# Patient Record
Sex: Male | Born: 1965 | Race: Black or African American | Hispanic: No | Marital: Married | Smoking: Current every day smoker
Health system: Southern US, Community
[De-identification: ages and names within clinical notes are randomized; demographics above are authoritative.]

## PROBLEM LIST (undated history)

## (undated) DIAGNOSIS — M1990 Unspecified osteoarthritis, unspecified site: Secondary | ICD-10-CM

## (undated) DIAGNOSIS — S21119A Laceration without foreign body of unspecified front wall of thorax without penetration into thoracic cavity, initial encounter: Secondary | ICD-10-CM

---
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
## 2003-01-03 ENCOUNTER — Emergency Department (HOSPITAL_COMMUNITY): Admission: EM | Admit: 2003-01-03 | Discharge: 2003-01-03 | Payer: Self-pay

## 2003-07-02 ENCOUNTER — Emergency Department (HOSPITAL_COMMUNITY): Admission: EM | Admit: 2003-07-02 | Discharge: 2003-07-02 | Payer: Self-pay | Admitting: Emergency Medicine

## 2003-11-01 ENCOUNTER — Emergency Department (HOSPITAL_COMMUNITY): Admission: EM | Admit: 2003-11-01 | Discharge: 2003-11-01 | Payer: Self-pay | Admitting: Emergency Medicine

## 2004-06-05 ENCOUNTER — Emergency Department (HOSPITAL_COMMUNITY): Admission: EM | Admit: 2004-06-05 | Discharge: 2004-06-05 | Payer: Self-pay | Admitting: Emergency Medicine

## 2004-11-16 ENCOUNTER — Ambulatory Visit: Payer: Self-pay | Admitting: Internal Medicine

## 2008-04-09 ENCOUNTER — Emergency Department (HOSPITAL_COMMUNITY): Admission: EM | Admit: 2008-04-09 | Discharge: 2008-04-09 | Payer: Self-pay | Admitting: Emergency Medicine

## 2008-07-14 ENCOUNTER — Emergency Department (HOSPITAL_COMMUNITY): Admission: EM | Admit: 2008-07-14 | Discharge: 2008-07-14 | Payer: Self-pay | Admitting: Emergency Medicine

## 2010-07-15 LAB — CBC
MCHC: 33.1 g/dL (ref 30.0–36.0)
MCV: 93.1 fL (ref 78.0–100.0)
RBC: 3.96 MIL/uL — ABNORMAL LOW (ref 4.22–5.81)
RDW: 13.5 % (ref 11.5–15.5)

## 2010-07-15 LAB — DIFFERENTIAL
Lymphocytes Relative: 49 % — ABNORMAL HIGH (ref 12–46)
Lymphs Abs: 2.3 10*3/uL (ref 0.7–4.0)
Neutrophils Relative %: 44 % (ref 43–77)

## 2010-07-15 LAB — POCT CARDIAC MARKERS
Myoglobin, poc: 65.2 ng/mL (ref 12–200)
Troponin i, poc: <0.05 ng/mL (ref 0.00–0.09)

## 2010-07-15 LAB — COMPREHENSIVE METABOLIC PANEL
AST: 28 U/L (ref 0–37)
CO2: 25 mEq/L (ref 19–32)
Calcium: 9 mg/dL (ref 8.4–10.5)
Creatinine, Ser: 0.81 mg/dL (ref 0.4–1.5)
GFR calc Af Amer: >60 mL/min (ref >60)
GFR calc non Af Amer: >60 mL/min (ref >60)

## 2012-07-14 ENCOUNTER — Encounter (HOSPITAL_COMMUNITY): Payer: Self-pay | Admitting: Emergency Medicine

## 2012-07-14 ENCOUNTER — Emergency Department (HOSPITAL_COMMUNITY)
Admission: EM | Admit: 2012-07-14 | Discharge: 2012-07-14 | Disposition: A | Discharge status: Home / Self Care | Payer: Self-pay | Attending: Emergency Medicine | Admitting: Emergency Medicine

## 2012-07-14 ENCOUNTER — Emergency Department (HOSPITAL_COMMUNITY): Payer: Self-pay

## 2012-07-14 DIAGNOSIS — M25519 Pain in unspecified shoulder: Secondary | ICD-10-CM | POA: Insufficient documentation

## 2012-07-14 DIAGNOSIS — M25511 Pain in right shoulder: Secondary | ICD-10-CM

## 2012-07-14 DIAGNOSIS — F17200 Nicotine dependence, unspecified, uncomplicated: Secondary | ICD-10-CM | POA: Insufficient documentation

## 2012-07-14 MED ORDER — HYDROCODONE-ACETAMINOPHEN 5-325 MG PO TABS: 1.0000 | ORAL_TABLET | ORAL | Status: DC | PRN

## 2012-07-14 MED ORDER — HYDROCODONE-ACETAMINOPHEN 5-325 MG PO TABS
1.0000 | ORAL_TABLET | Freq: Once | ORAL | Status: AC
  Administered 2012-07-14: 1 via ORAL
  Filled 2012-07-14: qty 1

## 2012-07-14 NOTE — ED Provider Notes (Signed)
History    This chart was scribed for non-physician practitioner working with Arthur Lennert, MD by Arthur Wise, ED Scribe. This patient was seen in room WTR5/WTR5 and the patient's care was started at 1553.   CSN: [id number]  Arrival date & time 07/14/12  1546   First MD Initiated Contact with Patient 07/14/12 1553      Chief Complaint  Patient presents with  . RT shoulder pain     (Consider location/radiation/quality/duration/timing/severity/associated sxs/prior treatment) The history is provided by the patient and medical records. No language interpreter was used.   Arthur Wise is a 47 y.o. male who presents to the Emergency Department complaining of sudden onset, sharp right shoulder pain that is aggravated by laying down and turning his body and improved by raising his arm that comes and goes and began 1 week ago. He denies any trauma or injury to the area, SOB, dizziness, or weakness. He states that he has been treating the pain at home with ibuprofen, Tylenol, muscle rubs, pushup, and lifting weights. 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   History reviewed. No pertinent past medical history.  History reviewed. No pertinent past surgical history.  Family History  Problem Relation Age of Onset  . Cancer Mother   . Diabetes Sister     History  Substance Use Topics  . Smoking status: Current Every Day Smoker  . Smokeless tobacco: Not on file  . Alcohol Use: Yes      Review of Systems  Musculoskeletal: Positive for arthralgias (right shoulder).  All other systems reviewed and are negative.    Allergies  Review of patient's allergies indicates no known allergies.  Home Medications   Current Outpatient Rx  Name  Route  Sig  Dispense  Refill  . ibuprofen (ADVIL,MOTRIN) 200 MG tablet   Oral   Take 400 mg by mouth every 6 (six) hours as needed for pain.         Marland Kitchen  HYDROcodone-acetaminophen (NORCO/VICODIN) 5-325 MG per tablet   Oral   Take 1 tablet by mouth every 4 (four) hours as needed for pain.   12 tablet   0     BP 118/72  Pulse 84  Temp(Src) 98.2 F (36.8 C) (Oral)  Resp 18  SpO2 96%  Physical Exam  Nursing note and vitals reviewed. Constitutional: He is oriented to person, place, and time. He appears well-developed and well-nourished. No distress.  HENT:  Head: Normocephalic and atraumatic.  Eyes: EOM are normal. Pupils are equal, round, and reactive to light.  Neck: Normal range of motion. Neck supple. No tracheal deviation present.  Cardiovascular: Normal rate.   Pulmonary/Chest: Effort normal. No respiratory distress.  Abdominal: Soft. He exhibits no distension.  Musculoskeletal: Normal range of motion. He exhibits no edema.       Right shoulder: He exhibits tenderness, pain and spasm. He exhibits normal range of motion, no bony tenderness, no swelling, no effusion, no crepitus, no deformity, no laceration, normal pulse and normal strength.       Arms: Neurological: He is alert and oriented to person, place, and time.  Skin: Skin is warm and dry.  Psychiatric: He has a normal mood and affect. His behavior is normal.    ED Course  Procedures (including critical care time)  DIAGNOSTIC STUDIES: Oxygen Saturation is 96% on room air, adequate by my interpretation.    COORDINATION OF CARE:  17:11- Discussed planned course of treatment with the patient, including antiinflammatories, pain medication, resting the arm when he is not at work, and following up with an orthopedist, who is agreeable at this time.  Labs Reviewed - No data to display Dg Shoulder Right  07/14/2012  *RADIOLOGY REPORT*  Clinical Data: Right scapula pain  RIGHT SHOULDER - 2+ VIEW  Comparison: None.  Findings: Three views of the right shoulder submitted. Degenerative changes are noted right acromioclavicular joint. Significant spurring of the acromion.   Glenohumeral joint is preserved. No acute fracture or subluxation.  IMPRESSION: No acute fracture or subluxation.  Degenerative changes as described above.   Original Report Authenticated By: Arthur Wise, M.D.      1. Shoulder pain, right      MDM  Rx Vicodin  Pt has been advised of the symptoms that warrant their return to the ED. Patient has voiced understanding and has agreed to follow-up with the PCP or specialist.  I personally performed the services described in this documentation, which was scribed in my presence. The recorded information has been reviewed and is accurate.       Arthur Matas, PA-C 07/14/12 1727

## 2012-07-14 NOTE — ED Notes (Signed)
Pain in r/shoulder blade, shoulder, radiating to r/elbow, x 1 week.

## 2012-07-14 NOTE — ED Provider Notes (Signed)
Medical screening examination/treatment/procedure(s) were performed by non-physician practitioner and as supervising physician I was immediately available for consultation/collaboration.   Melisa Donofrio L Graycie Halley, MD 07/14/12 2056 

## 2012-07-21 ENCOUNTER — Encounter (HOSPITAL_COMMUNITY): Payer: Self-pay | Admitting: *Deleted

## 2012-07-21 ENCOUNTER — Emergency Department (HOSPITAL_COMMUNITY)
Admission: EM | Admit: 2012-07-21 | Discharge: 2012-07-21 | Disposition: A | Discharge status: Home / Self Care | Payer: Self-pay | Attending: Emergency Medicine | Admitting: Emergency Medicine

## 2012-07-21 DIAGNOSIS — S46909A Unspecified injury of unspecified muscle, fascia and tendon at shoulder and upper arm level, unspecified arm, initial encounter: Secondary | ICD-10-CM | POA: Insufficient documentation

## 2012-07-21 DIAGNOSIS — S4980XA Other specified injuries of shoulder and upper arm, unspecified arm, initial encounter: Secondary | ICD-10-CM | POA: Insufficient documentation

## 2012-07-21 DIAGNOSIS — X500XXA Overexertion from strenuous movement or load, initial encounter: Secondary | ICD-10-CM | POA: Insufficient documentation

## 2012-07-21 DIAGNOSIS — Y929 Unspecified place or not applicable: Secondary | ICD-10-CM | POA: Insufficient documentation

## 2012-07-21 DIAGNOSIS — Z87828 Personal history of other (healed) physical injury and trauma: Secondary | ICD-10-CM | POA: Insufficient documentation

## 2012-07-21 DIAGNOSIS — M25511 Pain in right shoulder: Secondary | ICD-10-CM

## 2012-07-21 DIAGNOSIS — Y9389 Activity, other specified: Secondary | ICD-10-CM | POA: Insufficient documentation

## 2012-07-21 DIAGNOSIS — T148XXA Other injury of unspecified body region, initial encounter: Secondary | ICD-10-CM | POA: Insufficient documentation

## 2012-07-21 DIAGNOSIS — Z8739 Personal history of other diseases of the musculoskeletal system and connective tissue: Secondary | ICD-10-CM | POA: Insufficient documentation

## 2012-07-21 DIAGNOSIS — F17200 Nicotine dependence, unspecified, uncomplicated: Secondary | ICD-10-CM | POA: Insufficient documentation

## 2012-07-21 HISTORY — DX: Unspecified osteoarthritis, unspecified site: M19.90

## 2012-07-21 HISTORY — DX: Laceration without foreign body of unspecified front wall of thorax without penetration into thoracic cavity, initial encounter: S21.119A

## 2012-07-21 MED ORDER — IBUPROFEN 800 MG PO TABS: 800.0000 mg | ORAL_TABLET | Freq: Three times a day (TID) | ORAL | Status: DC

## 2012-07-21 MED ORDER — CYCLOBENZAPRINE HCL 10 MG PO TABS: 10.0000 mg | ORAL_TABLET | Freq: Two times a day (BID) | ORAL | Status: DC | PRN

## 2012-07-21 NOTE — ED Notes (Signed)
Pt was seen last week and was told he had arthritis in his right shoulder. Pt was given prescription for Vicodin and he took the full dose given but now states that they didn't help much. He denies following up with orthopedist as reccommended last visit because he thought the office would call him. Pt states the only relief he gets from the pain is when he lies on his back and lifts his arm slightly.
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
## 2012-07-22 NOTE — ED Provider Notes (Signed)
Medical screening examination/treatment/procedure(s) were performed by non-physician practitioner and as supervising physician I was immediately available for consultation/collaboration.   Kenzee Bassin E Treyvion Durkee, MD 07/22/12 1203 

## 2013-03-09 ENCOUNTER — Encounter (HOSPITAL_COMMUNITY): Payer: Self-pay | Admitting: Emergency Medicine

## 2013-03-09 ENCOUNTER — Emergency Department (HOSPITAL_COMMUNITY)
Admission: EM | Admit: 2013-03-09 | Discharge: 2013-03-09 | Disposition: A | Discharge status: Home / Self Care | Payer: Self-pay | Attending: Emergency Medicine | Admitting: Emergency Medicine

## 2013-03-09 DIAGNOSIS — F17200 Nicotine dependence, unspecified, uncomplicated: Secondary | ICD-10-CM | POA: Insufficient documentation

## 2013-03-09 DIAGNOSIS — Z8739 Personal history of other diseases of the musculoskeletal system and connective tissue: Secondary | ICD-10-CM | POA: Insufficient documentation

## 2013-03-09 DIAGNOSIS — R197 Diarrhea, unspecified: Secondary | ICD-10-CM | POA: Insufficient documentation

## 2013-03-09 DIAGNOSIS — Z87828 Personal history of other (healed) physical injury and trauma: Secondary | ICD-10-CM | POA: Insufficient documentation

## 2013-03-09 DIAGNOSIS — R1084 Generalized abdominal pain: Secondary | ICD-10-CM | POA: Insufficient documentation

## 2013-03-09 DIAGNOSIS — R112 Nausea with vomiting, unspecified: Secondary | ICD-10-CM | POA: Insufficient documentation

## 2013-03-09 LAB — CBC WITH DIFFERENTIAL/PLATELET
Eosinophils Absolute: 0.1 10*3/uL (ref 0.0–0.7)
Eosinophils Relative: 1 % (ref 0–5)
Hemoglobin: 14.3 g/dL (ref 13.0–17.0)
Lymphs Abs: 0.7 10*3/uL (ref 0.7–4.0)
MCH: 31.8 pg (ref 26.0–34.0)
MCV: 94.9 fL (ref 78.0–100.0)
Monocytes Relative: 8 % (ref 3–12)
Neutrophils Relative %: 82 % — ABNORMAL HIGH (ref 43–77)
RBC: 4.49 MIL/uL (ref 4.22–5.81)

## 2013-03-09 LAB — URINALYSIS, ROUTINE W REFLEX MICROSCOPIC
Glucose, UA: NEGATIVE mg/dL
Hgb urine dipstick: NEGATIVE
Protein, ur: NEGATIVE mg/dL
pH: 7.5 (ref 5.0–8.0)

## 2013-03-09 LAB — COMPREHENSIVE METABOLIC PANEL
AST: 21 U/L (ref 0–37)
Albumin: 3.6 g/dL (ref 3.5–5.2)
Calcium: 9.3 mg/dL (ref 8.4–10.5)
Creatinine, Ser: 0.82 mg/dL (ref 0.50–1.35)

## 2013-03-09 LAB — LIPASE, BLOOD: Lipase: 17 U/L (ref 11–59)

## 2013-03-09 MED ORDER — ONDANSETRON 8 MG PO TBDP: 8.0000 mg | ORAL_TABLET | Freq: Three times a day (TID) | ORAL | Status: DC | PRN

## 2013-03-09 MED ORDER — SODIUM CHLORIDE 0.9 % IV SOLN: 1000.0000 mL | Freq: Once | INTRAVENOUS | Status: DC

## 2013-03-09 MED ORDER — ONDANSETRON HCL 4 MG/2ML IJ SOLN
4.0000 mg | Freq: Once | INTRAMUSCULAR | Status: AC
  Administered 2013-03-09: 4 mg via INTRAVENOUS
  Filled 2013-03-09: qty 2

## 2013-03-09 MED ORDER — SODIUM CHLORIDE 0.9 % IV SOLN
1000.0000 mL | Freq: Once | INTRAVENOUS | Status: AC
  Administered 2013-03-09: 1000 mL via INTRAVENOUS

## 2013-03-09 MED ORDER — SODIUM CHLORIDE 0.9 % IV SOLN
1000.0000 mL | INTRAVENOUS | Status: DC
  Administered 2013-03-09 (×2): 1000 mL via INTRAVENOUS

## 2013-03-09 MED ORDER — MORPHINE SULFATE 4 MG/ML IJ SOLN
6.0000 mg | Freq: Once | INTRAMUSCULAR | Status: AC
  Administered 2013-03-09: 6 mg via INTRAVENOUS
  Filled 2013-03-09: qty 2
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
## 2013-03-09 NOTE — ED Notes (Signed)
Pt. Unable to urinate at this time. Nurse was  Notified.

## 2013-03-09 NOTE — ED Notes (Signed)
Bed: WA13 Expected date:  Expected time:  Means of arrival:  Comments: EMS-N/V 

## 2013-03-09 NOTE — ED Notes (Signed)
Pt reports n/v/d and R side abd pain since 0500 today. Pt normally drinks 8 drinks of etoh a day, has had one today. Pt states he had similar symptoms 3 weeks ago that lasted several days. Family also had n/v/d a week ago. EMS gave pt 4 mg zofran IV.

## 2014-03-30 ENCOUNTER — Emergency Department (HOSPITAL_COMMUNITY)
Admission: EM | Admit: 2014-03-30 | Discharge: 2014-03-30 | Discharge status: Against Medical Advice | Payer: Self-pay | Attending: Emergency Medicine | Admitting: Emergency Medicine

## 2014-03-30 ENCOUNTER — Encounter (HOSPITAL_COMMUNITY): Payer: Self-pay | Admitting: Emergency Medicine

## 2014-03-30 DIAGNOSIS — Y998 Other external cause status: Secondary | ICD-10-CM | POA: Insufficient documentation

## 2014-03-30 DIAGNOSIS — Z720 Tobacco use: Secondary | ICD-10-CM | POA: Insufficient documentation

## 2014-03-30 DIAGNOSIS — S01511A Laceration without foreign body of lip, initial encounter: Secondary | ICD-10-CM | POA: Insufficient documentation

## 2014-03-30 DIAGNOSIS — Y9289 Other specified places as the place of occurrence of the external cause: Secondary | ICD-10-CM | POA: Insufficient documentation

## 2014-03-30 DIAGNOSIS — Y9389 Activity, other specified: Secondary | ICD-10-CM | POA: Insufficient documentation

## 2014-03-30 DIAGNOSIS — S80212A Abrasion, left knee, initial encounter: Secondary | ICD-10-CM | POA: Insufficient documentation
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
## 2014-03-30 NOTE — ED Notes (Signed)
Pt requesting to be taken off of blood pressure machine, st's he is leaving.  St's he's not laying here all night and nobody taking care of him.  Pt walked out with girlfriend.

## 2014-03-30 NOTE — ED Notes (Signed)
SECURITY CALLED

## 2014-03-30 NOTE — ED Notes (Signed)
Pt monitored by pulse ox, bp cuff, and 12-lead. 

## 2014-04-04 ENCOUNTER — Ambulatory Visit (HOSPITAL_COMMUNITY): Payer: Self-pay

## 2014-04-04 ENCOUNTER — Encounter (HOSPITAL_COMMUNITY): Payer: Self-pay | Admitting: Emergency Medicine

## 2014-04-04 ENCOUNTER — Ambulatory Visit (HOSPITAL_COMMUNITY)
Admission: EM | Admit: 2014-04-04 | Discharge: 2014-04-04 | Disposition: A | Discharge status: Home / Self Care | Payer: Self-pay | Attending: Emergency Medicine | Admitting: Emergency Medicine

## 2014-04-04 DIAGNOSIS — Y929 Unspecified place or not applicable: Secondary | ICD-10-CM | POA: Insufficient documentation

## 2014-04-04 DIAGNOSIS — S02401A Maxillary fracture, unspecified, initial encounter for closed fracture: Secondary | ICD-10-CM | POA: Insufficient documentation

## 2014-04-04 DIAGNOSIS — S0993XA Unspecified injury of face, initial encounter: Secondary | ICD-10-CM

## 2014-04-04 DIAGNOSIS — S0003XA Contusion of scalp, initial encounter: Secondary | ICD-10-CM | POA: Insufficient documentation

## 2014-04-04 DIAGNOSIS — M79643 Pain in unspecified hand: Secondary | ICD-10-CM

## 2014-04-04 DIAGNOSIS — S60551A Superficial foreign body of right hand, initial encounter: Secondary | ICD-10-CM | POA: Insufficient documentation

## 2014-04-04 DIAGNOSIS — S022XXA Fracture of nasal bones, initial encounter for closed fracture: Secondary | ICD-10-CM | POA: Insufficient documentation

## 2014-04-04 DIAGNOSIS — J320 Chronic maxillary sinusitis: Secondary | ICD-10-CM | POA: Insufficient documentation

## 2014-04-04 DIAGNOSIS — W458XXA Other foreign body or object entering through skin, initial encounter: Secondary | ICD-10-CM | POA: Insufficient documentation

## 2014-04-04 DIAGNOSIS — J323 Chronic sphenoidal sinusitis: Secondary | ICD-10-CM | POA: Insufficient documentation

## 2014-04-04 MED ORDER — HYDROCODONE-ACETAMINOPHEN 5-325 MG PO TABS: 1.0000 | ORAL_TABLET | Freq: Four times a day (QID) | ORAL | Status: DC | PRN

## 2014-04-04 MED ORDER — CEPHALEXIN 500 MG PO CAPS: 500.0000 mg | ORAL_CAPSULE | Freq: Four times a day (QID) | ORAL | Status: DC

## 2014-04-04 MED ORDER — TETRACAINE HCL 0.5 % OP SOLN
1.0000 [drp] | Freq: Once | OPHTHALMIC | Status: AC
  Administered 2014-04-04: 1 [drp] via OPHTHALMIC
  Filled 2014-04-04: qty 2

## 2014-04-04 MED ORDER — HYDROCODONE-ACETAMINOPHEN 5-325 MG PO TABS
2.0000 | ORAL_TABLET | Freq: Once | ORAL | Status: AC
  Administered 2014-04-04: 2 via ORAL
  Filled 2014-04-04: qty 2

## 2014-04-04 MED ORDER — TETANUS-DIPHTH-ACELL PERTUSSIS 5-2.5-18.5 LF-MCG/0.5 IM SUSP
0.5000 mL | Freq: Once | INTRAMUSCULAR | Status: AC
  Administered 2014-04-04: 0.5 mL via INTRAMUSCULAR
  Filled 2014-04-04 (×2): qty 0.5

## 2014-04-04 MED ORDER — FLUORESCEIN SODIUM 1 MG OP STRP
1.0000 | ORAL_STRIP | Freq: Once | OPHTHALMIC | Status: AC
  Administered 2014-04-04: 1 via OPHTHALMIC
  Filled 2014-04-04: qty 1
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
## 2014-04-04 NOTE — ED Notes (Signed)
Pt comfortable with discharge and follow up instructions. Pt declines wheelchair, escorted to waiting area by this RN. Prescriptions x2. 
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
## 2014-04-04 NOTE — Discharge Instructions (Signed)
Today you were seen and evaluated for the following below. Please follow-up with hand surgery for your splinter, follow-up with otolaryngology for her nasal fracture. Follow-up with ophthalmology if your eye symptoms persist or if he develop any change or loss of vision or worsening of symptoms. Return to the ER should he develop any swelling of your hand, heat, redness, high fever, change in vision, blurred vision, severe headache, nausea, vomiting, severe dizziness. Refer to the resource guide below to help you find a primary care physician.   Nasal Fracture A nasal fracture is a break or crack in the bones of the nose. A minor break usually heals in a month. You often will receive black eyes from a nasal fracture. This is not a cause for concern. The black eyes will go away over 1 to 2 weeks.  DIAGNOSIS  Your caregiver may want to examine you if you are concerned about a fracture of the nose. X-rays of the nose may not show a nasal fracture even when one is present. Sometimes your caregiver must wait 1 to 5 days after the injury to re-check the nose for alignment and to take additional X-rays. Sometimes the caregiver must wait until the swelling has gone down. TREATMENT Minor fractures that have caused no deformity often do not require treatment. More serious fractures where bones are displaced may require surgery. This will take place after the swelling is gone. Surgery will stabilize and align the fracture. HOME CARE INSTRUCTIONS   Put ice on the injured area.  Put ice in a plastic bag.  Place a towel between your skin and the bag.  Leave the ice on for 15-20 minutes, 03-04 times a day.  Take medications as directed by your caregiver.  Only take over-the-counter or prescription medicines for pain, discomfort, or fever as directed by your caregiver.  If your nose starts bleeding, squeeze the soft parts of the nose against the center wall while you are sitting in an upright position for 10  minutes.  Contact sports should be avoided for at least 3 to 4 weeks or as directed by your caregiver. SEEK MEDICAL CARE IF:  Your pain increases or becomes severe.  You continue to have nosebleeds.  The shape of your nose does not return to normal within 5 days.  You have pus draining from the nose. SEEK IMMEDIATE MEDICAL CARE IF:   You have bleeding from your nose that does not stop after 20 minutes of pinching the nostrils closed and keeping ice on the nose.  You have clear fluid draining from your nose.  You notice a grape-like swelling on the dividing wall between the nostrils (septum). This is a collection of blood (hematoma) that must be drained to help prevent infection.  You have difficulty moving your eyes.  You have recurrent vomiting. Document Released: 03/19/2000 Document Revised: 06/14/2011 Document Reviewed: 07/06/2010 [facility] Patient Information 2015 [place], [place]. This information is not intended to replace advice given to you by your health care provider. Make sure you discuss any questions you have with your health care provider.  Your signs and symptoms were similar to those of a possible concussion A concussion, or closed-head injury, is a brain injury caused by a direct blow to the head or by a quick and sudden movement (jolt) of the head or neck. Concussions are usually not life-threatening. Even so, the effects of a concussion can be serious. If you have had a concussion before, you are more likely to experience concussion-like symptoms  after a direct blow to the head.  CAUSES  Direct blow to the head, such as from running into another player during a soccer game, being hit in a fight, or hitting your head on a hard surface.  A jolt of the head or neck that causes the brain to move back and forth inside the skull, such as in a car crash. SIGNS AND SYMPTOMS The signs of a concussion can be hard to notice. Early on, they may be missed by you, family members,  and health care providers. You may look fine but act or feel differently. Symptoms are usually temporary, but they may last for days, weeks, or even longer. Some symptoms may appear right away while others may not show up for hours or days. Every head injury is different. Symptoms include:  Mild to moderate headaches that will not go away.  A feeling of pressure inside your head.  Having more trouble than usual:  Learning or remembering things you have heard.  Answering questions.  Paying attention or concentrating.  Organizing daily tasks.  Making decisions and solving problems.  Slowness in thinking, acting or reacting, speaking, or reading.  Getting lost or being easily confused.  Feeling tired all the time or lacking energy (fatigued).  Feeling drowsy.  Sleep disturbances.  Sleeping more than usual.  Sleeping less than usual.  Trouble falling asleep.  Trouble sleeping (insomnia).  Loss of balance or feeling lightheaded or dizzy.  Nausea or vomiting.  Numbness or tingling.  Increased sensitivity to:  Sounds.  Lights.  Distractions.  Vision problems or eyes that tire easily.  Diminished sense of taste or smell.  Ringing in the ears.  Mood changes such as feeling sad or anxious.  Becoming easily irritated or angry for little or no reason.  Lack of motivation.  Seeing or hearing things other people do not see or hear (hallucinations). DIAGNOSIS Your health care provider can usually diagnose a concussion based on a description of your injury and symptoms. He or she will ask whether you passed out (lost consciousness) and whether you are having trouble remembering events that happened right before and during your injury. Your evaluation might include:  A brain scan to look for signs of injury to the brain. Even if the test shows no injury, you may still have a concussion.  Blood tests to be sure other problems are not  present. TREATMENT  Concussions are usually treated in an emergency department, in urgent care, or at a clinic. You may need to stay in the hospital overnight for further treatment.  Tell your health care provider if you are taking any medicines, including prescription medicines, over-the-counter medicines, and natural remedies. Some medicines, such as blood thinners (anticoagulants) and aspirin, may increase the chance of complications. Also tell your health care provider whether you have had alcohol or are taking illegal drugs. This information may affect treatment.  Your health care provider will send you home with important instructions to follow.  How fast you will recover from a concussion depends on many factors. These factors include how severe your concussion is, what part of your brain was injured, your age, and how healthy you were before the concussion.  Most people with mild injuries recover fully. Recovery can take time. In general, recovery is slower in older persons. Also, persons who have had a concussion in the past or have other medical problems may find that it takes longer to recover from their current injury. HOME CARE INSTRUCTIONS General  Instructions  Carefully follow the directions your health care provider gave you.  Only take over-the-counter or prescription medicines for pain, discomfort, or fever as directed by your health care provider.  Take only those medicines that your health care provider has approved.  Do not drink alcohol until your health care provider says you are well enough to do so. Alcohol and certain other drugs may slow your recovery and can put you at risk of further injury.  If it is harder than usual to remember things, write them down.  If you are easily distracted, try to do one thing at a time. For example, do not try to watch TV while fixing dinner.  Talk with family members or close friends when making important decisions.  Keep all  follow-up appointments. Repeated evaluation of your symptoms is recommended for your recovery.  Watch your symptoms and tell others to do the same. Complications sometimes occur after a concussion. Older adults with a brain injury may have a higher risk of serious complications, such as a blood clot on the brain.  Tell your teachers, school nurse, school counselor, coach, athletic trainer, or work Production designer, theatre/television/film about your injury, symptoms, and restrictions. Tell them about what you can or cannot do. They should watch for:  Increased problems with attention or concentration.  Increased difficulty remembering or learning new information.  Increased time needed to complete tasks or assignments.  Increased irritability or decreased ability to cope with stress.  Increased symptoms.  Rest. Rest helps the brain to heal. Make sure you:  Get plenty of sleep at night. Avoid staying up late at night.  Keep the same bedtime hours on weekends and weekdays.  Rest during the day. Take daytime naps or rest breaks when you feel tired.  Limit activities that require a lot of thought or concentration. These include:  Doing homework or job-related work.  Watching TV.  Working on the computer.  Avoid any situation where there is potential for another head injury (football, hockey, soccer, basketball, martial arts, downhill snow sports and horseback riding). Your condition will get worse every time you experience a concussion. You should avoid these activities until you are evaluated by the appropriate follow-up health care providers. Returning To Your Regular Activities You will need to return to your normal activities slowly, not all at once. You must give your body and brain enough time for recovery.  Do not return to sports or other athletic activities until your health care provider tells you it is safe to do so.  Ask your health care provider when you can drive, ride a bicycle, or operate heavy  machinery. Your ability to react may be slower after a brain injury. Never do these activities if you are dizzy.  Ask your health care provider about when you can return to work or school. Preventing Another Concussion It is very important to avoid another brain injury, especially before you have recovered. In rare cases, another injury can lead to permanent brain damage, brain swelling, or death. The risk of this is greatest during the first 7-10 days after a head injury. Avoid injuries by:  Wearing a seat belt when riding in a car.  Drinking alcohol only in moderation.  Wearing a helmet when biking, skiing, skateboarding, skating, or doing similar activities.  Avoiding activities that could lead to a second concussion, such as contact or recreational sports, until your health care provider says it is okay.  Taking safety measures in your home.  Remove clutter  and tripping hazards from floors and stairways.  Use grab bars in bathrooms and handrails by stairs.  Place non-slip mats on floors and in bathtubs.  Improve lighting in dim areas. SEEK MEDICAL CARE IF:  You have increased problems paying attention or concentrating.  You have increased difficulty remembering or learning new information.  You need more time to complete tasks or assignments than before.  You have increased irritability or decreased ability to cope with stress.  You have more symptoms than before. Seek medical care if you have any of the following symptoms for more than 2 weeks after your injury:  Lasting (chronic) headaches.  Dizziness or balance problems.  Nausea.  Vision problems.  Increased sensitivity to noise or light.  Depression or mood swings.  Anxiety or irritability.  Memory problems.  Difficulty concentrating or paying attention.  Sleep problems.  Feeling tired all the time. SEEK IMMEDIATE MEDICAL CARE IF:  You have severe or worsening headaches. These may be a sign of a  blood clot in the brain.  You have weakness (even if only in one hand, leg, or part of the face).  You have numbness.  You have decreased coordination.  You vomit repeatedly.  You have increased sleepiness.  One pupil is larger than the other.  You have convulsions.  You have slurred speech.  You have increased confusion. This may be a sign of a blood clot in the brain.  You have increased restlessness, agitation, or irritability.  You are unable to recognize people or places.  You have neck pain.  It is difficult to wake you up.  You have unusual behavior changes.  You lose consciousness. MAKE SURE YOU:  Understand these instructions.  Will watch your condition.  Will get help right away if you are not doing well or get worse. Document Released: 06/12/2003 Document Revised: 03/27/2013 Document Reviewed: 10/12/2012 [facility] Patient Information 2015 [place], [place]. This information is not intended to replace advice given to you by your health care provider. Make sure you discuss any questions you have with your health care provider.   Emergency Department Resource Guide 1) Find a Doctor and Pay Out of Pocket Although you won't have to find out who is covered by your insurance plan, it is a good idea to ask around and get recommendations. You will then need to call the office and see if the doctor you have chosen will accept you as a new patient and what types of options they offer for patients who are self-pay. Some doctors offer discounts or will set up payment plans for their patients who do not have insurance, but you will need to ask so you aren't surprised when you get to your appointment.  2) Contact Your Local Health Department Not all health departments have doctors that can see patients for sick visits, but many do, so it is worth a call to see if yours does. If you don't know where your local health department is, you can check in your phone book. The CDC also  has a tool to help you locate your state's health department, and many state websites also have listings of all of their local health departments.  3) Find a Walk-in Clinic If your illness is not likely to be very severe or complicated, you may want to try a walk in clinic. These are popping up all over the country in pharmacies, drugstores, and shopping centers. They're usually staffed by nurse practitioners or physician assistants that have been trained  to treat common illnesses and complaints. They're usually fairly quick and inexpensive. However, if you have serious medical issues or chronic medical problems, these are probably not your best option.  No Primary Care Doctor: - Call Health Connect at  [phone] - they can help you locate a primary care doctor that  accepts your insurance, provides certain services, etc. - Physician Referral Service- [phone]  Chronic Pain Problems: Organization         Address  Phone   Notes  [facility]  [phone] Patients need to be referred by their primary care doctor.   Medication Assistance: Organization         Address  Phone   Notes  [facility] Med[facility] [address] [place] [place], [place] [address] [phone] --Must be a resident of [facility] -- Must have NO insurance coverage whatsoever (no Medicaid/ Medicare, etc.) -- The pt. MUST have a primary care doctor that directs their care regularly and follows them in the community   [facility]  [phone]   [employer]  [phone]    Agencies that provide inexpensive medical care: Organization         Address  Phone   Notes  [facility]  [phone]   [facility]    [phone]   [facility] [address] [place], [place] [address] [phone]   [facility] [address] [place] [phone]   Planned Parenthood    [phone]    [facility]    [phone]   [facility] and [facility]  [address], [place] Phone:  [phone], Fax:  [phone] Hours of Operation:  9 am - 6 pm, M-F.  Also accepts Medicaid/Medicare and self-pay.  [facility]  [address], [place] Phone: [phone], Fax: [phone]. Hours of Operation:  8:30 am - 5:30 pm, M-F.  Also accepts Medicaid and self-pay.  [facility] [place] [address], [place] Phone: [phone]   [facility] [address] Natasha Bence [place], [place] [phone], Ext. [phone] Mondays & Thursdays: 7-9 AM.  First 15 patients are seen on a first come, first serve basis.    Medicaid-accepting [facility] Providers:  Organization         Address  Phone   Notes  [facility] [address], [place] [phone] Also accepts self-pay patients.  [facility] [address] Laurell Josephs [place], [place]  [phone]   [facility] [address], [place] [phone]   [facility] [address], [place] [phone]   Renaye Rakers [address], [place]   [phone] Only accepts [place] Access [place] patients after they have their name applied to their card.   Self-Pay (no insurance) in [facility]:  Organization         Address  Phone   Notes  Sickle Cell Patients, [facility] [address] [place], [place] [phone]   [facility] [address] [place], [place] [phone]   [facility] [place]  [address], [place] [phone]   [facility]/Dr. Osei-Bonsu  [address] [place] or [address], [place] [phone] Phone number for both [facility] and [place] locations is the same.  [facility] [address] [place] [phone]   [facility] [address], [place] or [address] [phone]   [facility] [address] [place] [phone], phone; [phone], fax Sees patients 1st and 3rd Saturday of every month.  Must not qualify for public or private insurance (i.e. Medicaid, Medicare, [place] Health Choice, Veterans' Benefits)  Household income should be no more than 200% of the poverty level The clinic cannot treat you if you are pregnant or think you are pregnant  Sexually transmitted diseases are not treated at the clinic.    Dental Care: Organization         Address  Phone  Notes  [facility] of [facility] [address] [place], [place] [phone] Accepts children up to age 33 who are enrolled in [place] or [place] Health Choice; pregnant women with a Medicaid card; and children who have applied for Medicaid or [place] Health Choice, but were declined, whose parents can pay a reduced fee at time of service.  [facility] of [facility]  [address], [place] [phone] Accepts children up to age 54 who are enrolled in [place] or [place] Health Choice; pregnant women with a Medicaid card; and children who have applied for Medicaid or [place] Health Choice, but were declined, whose parents can pay a reduced fee at time of service.  [facility]  [address] [place], [place] [phone] Patients are seen by appointment only. Walk-ins are not accepted. [facility] will see patients 65 years of age and older. Monday - Tuesday (8am-5pm) Most Wednesdays (8:30-5pm) $30 per visit, cash only  [facility]  [address], [facility] [phone] Patients are seen by appointment only. Walk-ins are not accepted. [facility] will see patients 52 years of age and older. One Wednesday  Evening (Monthly: Volunteer Based).  $30 per visit, cash only  [employer]  [phone] for adults; Children under age 26, call Graduate Pediatric Dentistry at [phone]. Children aged 61-14, please call [phone] to request a pediatric application.  Dental services are provided in all areas of dental care including fillings, crowns and bridges, complete and partial dentures, implants, gum treatment, root canals, and extractions. Preventive care is also provided. Treatment is provided to both adults and children. Patients are selected via a lottery and there is often a waiting list.   [facility] [address] [place]  [phone] [email or web address]   [facility] [address] [place], [place] [phone], Ext. [phone] Second and Fourth Thursday of each month, opens at 6:30 AM; Clinic ends at 9 AM.  Patients are seen on a first-come first-served basis, and a limited number are seen during each clinic.   [facility]  [address] Ether [place]ins [place], [place] [phone]   Eligibility Requirements You must have lived in [place], [place], or [place] counties for at least the last three months.   You cannot be eligible for state or federal sponsored [place], including [employer], [place], or [employer].   You generally cannot be eligible for healthcare insurance through your employer.    How to apply: Eligibility screenings are held every  Tuesday and Wednesday afternoon from 1:00 pm until 4:00 pm. You do not need an appointment for the interview!  [facility] [address], [place], [place] [phone]   [facility]  [phone]   [facility]  [phone]   [facility]  [phone]    Behavioral Health Resources in the Community: Intensive Outpatient Programs Organization         Address  Phone  Notes  [facility] [address], [place], [place] [phone]   [facility] [address], [place], [place] [phone]   [facility] [address], [place], [place]  [phone]   [facility] [address],  [place], [place] [phone] or [phone]   Substance Abuse Resources Organization         Address  Phone  Notes  [facility]  [phone]   [facility]  [phone]   [facility]  [phone]   [facility]  [phone]   Residential & Outpatient Substance Abuse Program  [phone]   Psychological Services Organization         Address  Phone  Notes  [facility]  [phone]- [phone]   [facility]  [phone]   [facility] [address], [place] [phone] or [phone]    Mobile Crisis Teams Organization         Address  Phone  Notes  [facility], Mobile Crisis Care Unit  [phone]   [facility]  [address] [place], [place] [phone]   Doristine LocksSharon DeEsch [address] [place] [phone]    Self-Help/Support Groups Organization         Address  Phone             Notes  [facility] [place] - variety of support groups  [phone] Call for more information  Narcotics Anonymous (NA), [facility] [address], [place]  2 meetings at this location   [facility]         Address  Phone  Notes  [facility] [address],    [place]  [phone]   [facility]  [address], [place] [id number], [place], [place] [phone]   [facility] [address], [place] [phone] Admissions: 8am-3pm M-F  [facility] [address]    [place], [place] [phone]   [facility] [address],  [place], [place] [phone]   [facility] [address]  [place], [place] [phone]   [facility] [address], [place], [place] [phone]   [facility] [address]  [place], [place] [phone] or [phone]   [facility] [address] [place], [place] [phone] Accepts Medicaid  [facility] [address]  [place] [phone] Substance Abuse/Addiction Treatment   [facility]         Address  Phone  Notes  [facility]  [phone]   Angie FavaJulie Brannon, PhD [address], [address] [place], [place]   [phone] or [phone]   [facility]    [address] [place], [place] [phone]   [facility] [address], [place], [place] [phone] Insurance/Medicaid/sponsorship through [facility]  [facility] [address] [place], [place] [phone] [place], [place] [phone]    Dr. Adele Schilder  [phone]   [facility] 1) [address], [place] 2) [place] 3)  [address], [place] [phone]  [phone]   [place] [phone] or [phone] (After Hours)

## 2014-04-05 ENCOUNTER — Emergency Department (HOSPITAL_COMMUNITY)
Admission: EM | Admit: 2014-04-05 | Discharge: 2014-04-06 | Disposition: A | Discharge status: Home / Self Care | Payer: Self-pay | Attending: Emergency Medicine | Admitting: Emergency Medicine

## 2014-04-05 ENCOUNTER — Other Ambulatory Visit: Payer: Self-pay

## 2014-04-05 ENCOUNTER — Encounter (HOSPITAL_COMMUNITY): Payer: Self-pay

## 2014-04-05 ENCOUNTER — Emergency Department (HOSPITAL_COMMUNITY): Payer: Self-pay

## 2014-04-05 DIAGNOSIS — Y9389 Activity, other specified: Secondary | ICD-10-CM | POA: Insufficient documentation

## 2014-04-05 DIAGNOSIS — X58XXXA Exposure to other specified factors, initial encounter: Secondary | ICD-10-CM | POA: Insufficient documentation

## 2014-04-05 DIAGNOSIS — Y9289 Other specified places as the place of occurrence of the external cause: Secondary | ICD-10-CM | POA: Insufficient documentation

## 2014-04-05 DIAGNOSIS — S01511A Laceration without foreign body of lip, initial encounter: Secondary | ICD-10-CM | POA: Insufficient documentation

## 2014-04-05 DIAGNOSIS — Z720 Tobacco use: Secondary | ICD-10-CM | POA: Insufficient documentation

## 2014-04-05 DIAGNOSIS — Z79899 Other long term (current) drug therapy: Secondary | ICD-10-CM | POA: Insufficient documentation

## 2014-04-05 DIAGNOSIS — M1990 Unspecified osteoarthritis, unspecified site: Secondary | ICD-10-CM | POA: Insufficient documentation

## 2014-04-05 DIAGNOSIS — R0600 Dyspnea, unspecified: Secondary | ICD-10-CM | POA: Insufficient documentation

## 2014-04-05 DIAGNOSIS — Y998 Other external cause status: Secondary | ICD-10-CM | POA: Insufficient documentation

## 2014-04-05 LAB — BASIC METABOLIC PANEL
Anion gap: 11 (ref 5–15)
BUN: 9 mg/dL (ref 6–23)
CO2: 21 mmol/L (ref 19–32)
Calcium: 9.8 mg/dL (ref 8.4–10.5)
Chloride: 107 mEq/L (ref 96–112)
Creatinine, Ser: 0.8 mg/dL (ref 0.50–1.35)
GFR calc Af Amer: >90 mL/min (ref >90)
GFR calc non Af Amer: >90 mL/min (ref >90)
Glucose, Bld: 93 mg/dL (ref 70–99)
Potassium: 3.6 mmol/L (ref 3.5–5.1)
Sodium: 139 mmol/L (ref 135–145)

## 2014-04-05 LAB — CBC WITH DIFFERENTIAL/PLATELET
Basophils Absolute: 0 10*3/uL (ref 0.0–0.1)
Basophils Relative: 0 % (ref 0–1)
Eosinophils Absolute: 0.1 10*3/uL (ref 0.0–0.7)
Eosinophils Relative: 2 % (ref 0–5)
HCT: 37.8 % — ABNORMAL LOW (ref 39.0–52.0)
Hemoglobin: 13 g/dL (ref 13.0–17.0)
Lymphocytes Relative: 39 % (ref 12–46)
Lymphs Abs: 2.6 10*3/uL (ref 0.7–4.0)
MCH: 32.2 pg (ref 26.0–34.0)
MCHC: 34.4 g/dL (ref 30.0–36.0)
MCV: 93.6 fL (ref 78.0–100.0)
Monocytes Absolute: 0.7 10*3/uL (ref 0.1–1.0)
Monocytes Relative: 11 % (ref 3–12)
Neutro Abs: 3.3 10*3/uL (ref 1.7–7.7)
Neutrophils Relative %: 48 % (ref 43–77)
Platelets: 226 10*3/uL (ref 150–400)
RBC: 4.04 MIL/uL — ABNORMAL LOW (ref 4.22–5.81)
RDW: 12.8 % (ref 11.5–15.5)
WBC: 6.8 10*3/uL (ref 4.0–10.5)

## 2014-04-05 LAB — TROPONIN I: Troponin I: <0.03 ng/mL (ref <0.031)

## 2014-04-05 MED ORDER — LIDOCAINE HCL (PF) 1 % IJ SOLN
5.0000 mL | Freq: Once | INTRAMUSCULAR | Status: AC
  Administered 2014-04-06: 5 mL
  Filled 2014-04-05: qty 5

## 2014-04-05 MED ORDER — LORAZEPAM 1 MG PO TABS
1.0000 mg | ORAL_TABLET | Freq: Once | ORAL | Status: AC
  Administered 2014-04-05: 1 mg via ORAL
  Filled 2014-04-05: qty 1

## 2014-04-05 NOTE — ED Notes (Signed)
Per EMS pt woke up at 9 am with ETOH and 1"blunt"; Pt states he smokes 1 pack of cigarettes per day; pt states he started having Sob about 1 hour ago; Pt c/o of dizziness; denies pain on arrival;
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
## 2014-04-05 NOTE — ED Notes (Signed)
MD at bedside. 

## 2014-04-06 NOTE — Discharge Instructions (Signed)
Laceration Care, Adult °A laceration is a cut or lesion that goes through all layers of the skin and into the tissue just beneath the skin. °TREATMENT  °Some lacerations may not require closure. Some lacerations may not be able to be closed due to an increased risk of infection. It is important to see your caregiver as soon as possible after an injury to minimize the risk of infection and maximize the opportunity for successful closure. °If closure is appropriate, pain medicines may be given, if needed. The wound will be cleaned to help prevent infection. Your caregiver will use stitches (sutures), staples, wound glue (adhesive), or skin adhesive strips to repair the laceration. These tools bring the skin edges together to allow for faster healing and a better cosmetic outcome. However, all wounds will heal with a scar. Once the wound has healed, scarring can be minimized by covering the wound with sunscreen during the day for 1 full year. °HOME CARE INSTRUCTIONS  °For sutures or staples: °· Keep the wound clean and dry. °· If you were given a bandage (dressing), you should change it at least once a day. Also, change the dressing if it becomes wet or dirty, or as directed by your caregiver. °· Wash the wound with soap and water 2 times a day. Rinse the wound off with water to remove all soap. Pat the wound dry with a clean towel. °· After cleaning, apply a thin layer of the antibiotic ointment as recommended by your caregiver. This will help prevent infection and keep the dressing from sticking. °· You may shower as usual after the first 24 hours. Do not soak the wound in water until the sutures are removed. °· Only take over-the-counter or prescription medicines for pain, discomfort, or fever as directed by your caregiver. °· Get your sutures or staples removed as directed by your caregiver. °For skin adhesive strips: °· Keep the wound clean and dry. °· Do not get the skin adhesive strips wet. You may bathe  carefully, using caution to keep the wound dry. °· If the wound gets wet, pat it dry with a clean towel. °· Skin adhesive strips will fall off on their own. You may trim the strips as the wound heals. Do not remove skin adhesive strips that are still stuck to the wound. They will fall off in time. °For wound adhesive: °· You may briefly wet your wound in the shower or bath. Do not soak or scrub the wound. Do not swim. Avoid periods of heavy perspiration until the skin adhesive has fallen off on its own. After showering or bathing, gently pat the wound dry with a clean towel. °· Do not apply liquid medicine, cream medicine, or ointment medicine to your wound while the skin adhesive is in place. This may loosen the film before your wound is healed. °· If a dressing is placed over the wound, be careful not to apply tape directly over the skin adhesive. This may cause the adhesive to be pulled off before the wound is healed. °· Avoid prolonged exposure to sunlight or tanning lamps while the skin adhesive is in place. Exposure to ultraviolet light in the first year will darken the scar. °· The skin adhesive will usually remain in place for 5 to 10 days, then naturally fall off the skin. Do not pick at the adhesive film. °You may need a tetanus shot if: °· You cannot remember when you had your last tetanus shot. °· You have never had a tetanus   shot. If you get a tetanus shot, your arm may swell, get red, and feel warm to the touch. This is common and not a problem. If you need a tetanus shot and you choose not to have one, there is a rare chance of getting tetanus. Sickness from tetanus can be serious. SEEK MEDICAL CARE IF:   You have redness, swelling, or increasing pain in the wound.  You see a red line that goes away from the wound.  You have yellowish-white fluid (pus) coming from the wound.  You have a fever.  You notice a bad smell coming from the wound or dressing.  Your wound breaks open before or  after sutures have been removed.  You notice something coming out of the wound such as wood or glass.  Your wound is on your hand or foot and you cannot move a finger or toe. SEEK IMMEDIATE MEDICAL CARE IF:   Your pain is not controlled with prescribed medicine.  You have severe swelling around the wound causing pain and numbness or a change in color in your arm, hand, leg, or foot.  Your wound splits open and starts bleeding.  You have worsening numbness, weakness, or loss of function of any joint around or beyond the wound.  You develop painful lumps near the wound or on the skin anywhere on your body. MAKE SURE YOU:   Understand these instructions.  Will watch your condition.  Will get help right away if you are not doing well or get worse. Document Released: 03/22/2005 Document Revised: 06/14/2011 Document Reviewed: 09/15/2010 [facility] Patient Information 2015 [place], [place]. This information is not intended to replace advice given to you by your health care provider. Make sure you discuss any questions you have with your health care provider.  Shortness of Breath Shortness of breath means you have trouble breathing. It could also mean that you have a medical problem. You should get immediate medical care for shortness of breath. CAUSES   Not enough oxygen in the air such as with high altitudes or a smoke-filled room.  Certain lung diseases, infections, or problems.  Heart disease or conditions, such as angina or heart failure.  Low red blood cells (anemia).  Poor physical fitness, which can cause shortness of breath when you exercise.  Chest or back injuries or stiffness.  Being overweight.  Smoking.  Anxiety, which can make you feel like you are not getting enough air. DIAGNOSIS  Serious medical problems can often be found during your physical exam. Tests may also be done to determine why you are having shortness of breath. Tests may include:  Chest  X-rays.  Lung function tests.  Blood tests.  An electrocardiogram (ECG).  An ambulatory electrocardiogram. An ambulatory ECG records your heartbeat patterns over a 24-hour period.  Exercise testing.  A transthoracic echocardiogram (TTE). During echocardiography, sound waves are used to evaluate how blood flows through your heart.  A transesophageal echocardiogram (TEE).  Imaging scans. Your health care provider may not be able to find a cause for your shortness of breath after your exam. In this case, it is important to have a follow-up exam with your health care provider as directed.  TREATMENT  Treatment for shortness of breath depends on the cause of your symptoms and can vary greatly. HOME CARE INSTRUCTIONS   Do not smoke. Smoking is a common cause of shortness of breath. If you smoke, ask for help to quit.  Avoid being around chemicals or things that may bother  your breathing, such as paint fumes and dust.  Rest as needed. Slowly resume your usual activities.  If medicines were prescribed, take them as directed for the full length of time directed. This includes oxygen and any inhaled medicines.  Keep all follow-up appointments as directed by your health care provider. SEEK MEDICAL CARE IF:   Your condition does not improve in the time expected.  You have a hard time doing your normal activities even with rest.  You have any new symptoms. SEEK IMMEDIATE MEDICAL CARE IF:   Your shortness of breath gets worse.  You feel light-headed, faint, or develop a cough not controlled with medicines.  You start coughing up blood.  You have pain with breathing.  You have chest pain or pain in your arms, shoulders, or abdomen.  You have a fever.  You are unable to walk up stairs or exercise the way you normally do. MAKE SURE YOU:  Understand these instructions.  Will watch your condition.  Will get help right away if you are not doing well or get worse. Document  Released: 12/15/2000 Document Revised: 03/27/2013 Document Reviewed: 06/07/2011 [facility] Patient Information 2015 [place], [place]. This information is not intended to replace advice given to you by your health care provider. Make sure you discuss any questions you have with your health care provider.

## 2014-09-28 ENCOUNTER — Encounter (HOSPITAL_COMMUNITY): Payer: Self-pay | Admitting: Physical Medicine and Rehabilitation

## 2014-09-28 ENCOUNTER — Emergency Department (HOSPITAL_COMMUNITY): Payer: Self-pay

## 2014-09-28 ENCOUNTER — Emergency Department (HOSPITAL_COMMUNITY)
Admission: EM | Admit: 2014-09-28 | Discharge: 2014-09-28 | Disposition: A | Discharge status: Home / Self Care | Payer: Self-pay | Attending: Emergency Medicine | Admitting: Emergency Medicine

## 2014-09-28 DIAGNOSIS — Z792 Long term (current) use of antibiotics: Secondary | ICD-10-CM | POA: Insufficient documentation

## 2014-09-28 DIAGNOSIS — Z720 Tobacco use: Secondary | ICD-10-CM | POA: Insufficient documentation

## 2014-09-28 DIAGNOSIS — M1990 Unspecified osteoarthritis, unspecified site: Secondary | ICD-10-CM | POA: Insufficient documentation

## 2014-09-28 DIAGNOSIS — Z87828 Personal history of other (healed) physical injury and trauma: Secondary | ICD-10-CM | POA: Insufficient documentation

## 2014-09-28 DIAGNOSIS — R079 Chest pain, unspecified: Secondary | ICD-10-CM | POA: Insufficient documentation

## 2014-09-28 LAB — BASIC METABOLIC PANEL
Anion gap: 8 (ref 5–15)
BUN: 6 mg/dL (ref 6–20)
CO2: 23 mmol/L (ref 22–32)
Calcium: 8.6 mg/dL — ABNORMAL LOW (ref 8.9–10.3)
Chloride: 108 mmol/L (ref 101–111)
Creatinine, Ser: 0.85 mg/dL (ref 0.61–1.24)
GFR calc Af Amer: >60 mL/min (ref >60)
GFR calc non Af Amer: >60 mL/min (ref >60)
Glucose, Bld: 83 mg/dL (ref 65–99)
Potassium: 3.8 mmol/L (ref 3.5–5.1)
Sodium: 139 mmol/L (ref 135–145)

## 2014-09-28 LAB — CBC WITH DIFFERENTIAL/PLATELET
Basophils Absolute: 0 10*3/uL (ref 0.0–0.1)
Basophils Relative: 0 % (ref 0–1)
Eosinophils Absolute: 0.1 10*3/uL (ref 0.0–0.7)
Eosinophils Relative: 3 % (ref 0–5)
HCT: 35.2 % — ABNORMAL LOW (ref 39.0–52.0)
Hemoglobin: 11.7 g/dL — ABNORMAL LOW (ref 13.0–17.0)
Lymphocytes Relative: 35 % (ref 12–46)
Lymphs Abs: 1.7 10*3/uL (ref 0.7–4.0)
MCH: 30.6 pg (ref 26.0–34.0)
MCHC: 33.2 g/dL (ref 30.0–36.0)
MCV: 92.1 fL (ref 78.0–100.0)
Monocytes Absolute: 0.5 10*3/uL (ref 0.1–1.0)
Monocytes Relative: 9 % (ref 3–12)
Neutro Abs: 2.6 10*3/uL (ref 1.7–7.7)
Neutrophils Relative %: 53 % (ref 43–77)
Platelets: 208 10*3/uL (ref 150–400)
RBC: 3.82 MIL/uL — ABNORMAL LOW (ref 4.22–5.81)
RDW: 12.5 % (ref 11.5–15.5)
WBC: 4.9 10*3/uL (ref 4.0–10.5)

## 2014-09-28 LAB — TROPONIN I: Troponin I: <0.03 ng/mL (ref <0.031)

## 2014-09-28 MED ORDER — SODIUM CHLORIDE 0.9 % IV BOLUS (SEPSIS)
1000.0000 mL | Freq: Once | INTRAVENOUS | Status: AC
  Administered 2014-09-28: 1000 mL via INTRAVENOUS
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
## 2014-09-28 NOTE — ED Notes (Signed)
Pt transported to xray 

## 2014-09-28 NOTE — ED Notes (Signed)
Pt presents to department via [facility] from work. Sudden onset of non radiating L sided chest pain @6 :15am this morning. 5/10 pain upon arrival to ED. Received 324ASA and (2) sublingual nitro without relief. Pt is alert and oriented x4. 20g L forearm.

## 2014-09-28 NOTE — Discharge Instructions (Signed)

## 2015-12-02 ENCOUNTER — Emergency Department (HOSPITAL_COMMUNITY)
Admission: EM | Admit: 2015-12-02 | Discharge: 2015-12-02 | Disposition: A | Discharge status: Home / Self Care | Payer: Self-pay | Attending: Emergency Medicine | Admitting: Emergency Medicine

## 2015-12-02 ENCOUNTER — Encounter (HOSPITAL_COMMUNITY): Payer: Self-pay | Admitting: Emergency Medicine

## 2015-12-02 DIAGNOSIS — L509 Urticaria, unspecified: Secondary | ICD-10-CM | POA: Insufficient documentation

## 2015-12-02 DIAGNOSIS — F17210 Nicotine dependence, cigarettes, uncomplicated: Secondary | ICD-10-CM | POA: Insufficient documentation

## 2015-12-02 MED ORDER — HYDROXYZINE HCL 25 MG PO TABS: 25.0000 mg | ORAL_TABLET | Freq: Four times a day (QID) | ORAL | 0 refills | Status: DC

## 2015-12-02 MED ORDER — FAMOTIDINE IN NACL 20-0.9 MG/50ML-% IV SOLN
20.0000 mg | Freq: Once | INTRAVENOUS | Status: AC
  Administered 2015-12-02: 20 mg via INTRAVENOUS
  Filled 2015-12-02: qty 50

## 2015-12-02 MED ORDER — METHYLPREDNISOLONE SODIUM SUCC 125 MG IJ SOLR
125.0000 mg | Freq: Once | INTRAMUSCULAR | Status: AC
  Administered 2015-12-02: 125 mg via INTRAVENOUS
  Filled 2015-12-02: qty 2

## 2015-12-02 MED ORDER — EPINEPHRINE 0.3 MG/0.3ML IJ SOAJ: 0.3000 mg | Freq: Once | INTRAMUSCULAR | 0 refills | Status: AC

## 2015-12-02 MED ORDER — PREDNISONE 10 MG (21) PO TBPK: 10.0000 mg | ORAL_TABLET | Freq: Every day | ORAL | 0 refills | Status: DC

## 2015-12-02 MED ORDER — HYDROXYZINE HCL 25 MG PO TABS
25.0000 mg | ORAL_TABLET | Freq: Once | ORAL | Status: AC
  Administered 2015-12-02: 25 mg via ORAL
  Filled 2015-12-02: qty 1
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
## 2016-10-12 ENCOUNTER — Encounter (HOSPITAL_COMMUNITY): Payer: Self-pay | Admitting: Emergency Medicine

## 2016-10-12 ENCOUNTER — Emergency Department (HOSPITAL_COMMUNITY)
Admission: EM | Admit: 2016-10-12 | Discharge: 2016-10-12 | Disposition: A | Discharge status: Home / Self Care | Payer: Self-pay | Attending: Emergency Medicine | Admitting: Emergency Medicine

## 2016-10-12 DIAGNOSIS — F17210 Nicotine dependence, cigarettes, uncomplicated: Secondary | ICD-10-CM | POA: Insufficient documentation

## 2016-10-12 DIAGNOSIS — Z79899 Other long term (current) drug therapy: Secondary | ICD-10-CM | POA: Insufficient documentation

## 2016-10-12 DIAGNOSIS — K209 Esophagitis, unspecified without bleeding: Secondary | ICD-10-CM

## 2016-10-12 DIAGNOSIS — R1310 Dysphagia, unspecified: Secondary | ICD-10-CM | POA: Insufficient documentation

## 2016-10-12 MED ORDER — GI COCKTAIL ~~LOC~~
30.0000 mL | Freq: Once | ORAL | Status: AC
  Administered 2016-10-12: 30 mL via ORAL
  Filled 2016-10-12: qty 30

## 2016-10-12 MED ORDER — PANTOPRAZOLE SODIUM 20 MG PO TBEC: 20.0000 mg | DELAYED_RELEASE_TABLET | Freq: Two times a day (BID) | ORAL | 0 refills | Status: DC

## 2016-10-12 MED ORDER — PANTOPRAZOLE SODIUM 40 MG PO TBEC
40.0000 mg | DELAYED_RELEASE_TABLET | Freq: Once | ORAL | Status: AC
  Administered 2016-10-12: 40 mg via ORAL
  Filled 2016-10-12: qty 1

## 2016-10-12 NOTE — ED Triage Notes (Signed)
Pt. Stated, I have something stuck in my throat for 2 days.  Its a family trait. My mom said its my esophagus getting thinner.
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
## 2017-01-18 ENCOUNTER — Encounter (HOSPITAL_COMMUNITY): Payer: Self-pay

## 2017-01-18 ENCOUNTER — Emergency Department (HOSPITAL_COMMUNITY)
Admission: EM | Admit: 2017-01-18 | Discharge: 2017-01-18 | Disposition: A | Discharge status: Home / Self Care | Payer: BLUE CROSS/BLUE SHIELD | Attending: Emergency Medicine | Admitting: Emergency Medicine

## 2017-01-18 DIAGNOSIS — N410 Acute prostatitis: Secondary | ICD-10-CM | POA: Insufficient documentation

## 2017-01-18 DIAGNOSIS — Z79899 Other long term (current) drug therapy: Secondary | ICD-10-CM | POA: Diagnosis not present

## 2017-01-18 DIAGNOSIS — F17210 Nicotine dependence, cigarettes, uncomplicated: Secondary | ICD-10-CM | POA: Diagnosis not present

## 2017-01-18 DIAGNOSIS — R300 Dysuria: Secondary | ICD-10-CM | POA: Diagnosis present

## 2017-01-18 LAB — URINALYSIS, ROUTINE W REFLEX MICROSCOPIC
Bilirubin Urine: NEGATIVE
Glucose, UA: NEGATIVE mg/dL
Ketones, ur: NEGATIVE mg/dL
Nitrite: NEGATIVE
Protein, ur: 100 mg/dL — AB
Specific Gravity, Urine: 1.016 (ref 1.005–1.030)
Squamous Epithelial / LPF: NONE SEEN
pH: 5 (ref 5.0–8.0)

## 2017-01-18 MED ORDER — PHENAZOPYRIDINE HCL 100 MG PO TABS
100.0000 mg | ORAL_TABLET | Freq: Once | ORAL | Status: AC
  Administered 2017-01-18: 100 mg via ORAL
  Filled 2017-01-18: qty 1

## 2017-01-18 MED ORDER — SULFAMETHOXAZOLE-TRIMETHOPRIM 800-160 MG PO TABS
1.0000 | ORAL_TABLET | Freq: Once | ORAL | Status: AC
  Administered 2017-01-18: 1 via ORAL
  Filled 2017-01-18: qty 1

## 2017-01-18 MED ORDER — SULFAMETHOXAZOLE-TRIMETHOPRIM 800-160 MG PO TABS: 1.0000 | ORAL_TABLET | Freq: Two times a day (BID) | ORAL | 0 refills | Status: AC

## 2017-01-18 MED ORDER — TRAMADOL HCL 50 MG PO TABS: 50.0000 mg | ORAL_TABLET | Freq: Four times a day (QID) | ORAL | 0 refills | Status: DC | PRN

## 2017-01-18 MED ORDER — HYDROCODONE-ACETAMINOPHEN 5-325 MG PO TABS
1.0000 | ORAL_TABLET | Freq: Once | ORAL | Status: AC
  Administered 2017-01-18: 1 via ORAL
  Filled 2017-01-18: qty 1

## 2017-01-18 MED ORDER — PHENAZOPYRIDINE HCL 200 MG PO TABS: 200.0000 mg | ORAL_TABLET | Freq: Three times a day (TID) | ORAL | 0 refills | Status: DC

## 2017-01-18 NOTE — Discharge Instructions (Signed)
Follow up with your PCP before completion of antibiotics.Take as prescribed until gone. Pyridium will make your urine orange, but will help with burning. You only need to take it until pain is gone. Ultram is a pain medication. Do not work or drive while taking.

## 2017-01-18 NOTE — ED Notes (Signed)
Called and notified lab of urine culture add on and modified order as an add on

## 2017-01-18 NOTE — ED Notes (Signed)
Patient denies pain and is resting comfortably.  
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
## 2017-01-18 NOTE — ED Triage Notes (Signed)
Patient complains of dysuria and bladder pressure x 2 days, no associated symptoms. Alert and oriented

## 2017-01-20 LAB — URINE CULTURE

## 2017-01-21 ENCOUNTER — Telehealth: Payer: Self-pay

## 2017-01-21 NOTE — Telephone Encounter (Signed)
Post ED Visit - Positive Culture Follow-up  Culture report reviewed by antimicrobial stewardship pharmacist:  [x]  Enzo BiNathan Batchelder, Pharm.D. []  Celedonio MiyamotoJeremy Frens, Pharm.D., BCPS AQ-ID []  Garvin FilaMike Maccia, Pharm.D., BCPS []  Georgina PillionElizabeth Martin, Pharm.D., BCPS []  Oak HillMinh Pham, [address] Rainbow BoulevardPharm.D., BCPS, AAHIVP []  Estella HuskMichelle Turner, Pharm.D., BCPS, AAHIVP []  Lysle Pearlachel Rumbarger, PharmD, BCPS []  Casilda Carlsaylor Stone, PharmD, BCPS []  Pollyann SamplesAndy Johnston, PharmD, BCPS  Positive urine culture Treated with Sulfamethoxazole-Trimethoprim, organism sensitive to the same and no further patient follow-up is required at this time.  Jerry CarasCullom, Analyse [employer]ett 01/21/2017, 11:07 AM

## 2018-04-15 ENCOUNTER — Encounter (HOSPITAL_COMMUNITY): Payer: Self-pay | Admitting: *Deleted

## 2018-04-15 ENCOUNTER — Ambulatory Visit (HOSPITAL_COMMUNITY)
Admission: EM | Admit: 2018-04-15 | Discharge: 2018-04-15 | Disposition: A | Discharge status: Home / Self Care | Payer: BLUE CROSS/BLUE SHIELD | Attending: Family Medicine | Admitting: Family Medicine

## 2018-04-15 ENCOUNTER — Other Ambulatory Visit: Payer: Self-pay

## 2018-04-15 DIAGNOSIS — R197 Diarrhea, unspecified: Secondary | ICD-10-CM | POA: Insufficient documentation

## 2018-04-15 LAB — CBC WITH DIFFERENTIAL/PLATELET
Abs Immature Granulocytes: 0.03 10*3/uL (ref 0.00–0.07)
Basophils Absolute: 0 10*3/uL (ref 0.0–0.1)
Basophils Relative: 0 %
Eosinophils Absolute: 0.1 10*3/uL (ref 0.0–0.5)
Eosinophils Relative: 1 %
HCT: 38.7 % — ABNORMAL LOW (ref 39.0–52.0)
Hemoglobin: 12.5 g/dL — ABNORMAL LOW (ref 13.0–17.0)
Immature Granulocytes: 0 %
Lymphocytes Relative: 16 %
Lymphs Abs: 1.3 10*3/uL (ref 0.7–4.0)
MCH: 31.1 pg (ref 26.0–34.0)
MCHC: 32.3 g/dL (ref 30.0–36.0)
MCV: 96.3 fL (ref 80.0–100.0)
Monocytes Absolute: 0.6 10*3/uL (ref 0.1–1.0)
Monocytes Relative: 8 %
Neutro Abs: 6.2 10*3/uL (ref 1.7–7.7)
Neutrophils Relative %: 75 %
Platelets: 210 10*3/uL (ref 150–400)
RBC: 4.02 MIL/uL — ABNORMAL LOW (ref 4.22–5.81)
RDW: 12.8 % (ref 11.5–15.5)
WBC: 8.3 10*3/uL (ref 4.0–10.5)
nRBC: 0 % (ref 0.0–0.2)

## 2018-04-15 LAB — POCT URINALYSIS DIP (DEVICE)
BILIRUBIN URINE: NEGATIVE
Glucose, UA: NEGATIVE mg/dL
HGB URINE DIPSTICK: NEGATIVE
KETONES UR: NEGATIVE mg/dL
LEUKOCYTES UA: NEGATIVE
NITRITE: NEGATIVE
PH: 6.5 (ref 5.0–8.0)
Protein, ur: NEGATIVE mg/dL
Specific Gravity, Urine: 1.02 (ref 1.005–1.030)
Urobilinogen, UA: 4 mg/dL — ABNORMAL HIGH (ref 0.0–1.0)

## 2018-04-15 MED ORDER — CIPROFLOXACIN HCL 500 MG PO TABS: 500.0000 mg | ORAL_TABLET | Freq: Two times a day (BID) | ORAL | 0 refills | Status: DC

## 2018-04-15 MED ORDER — AZITHROMYCIN 250 MG PO TABS
ORAL_TABLET | ORAL | Status: AC
  Filled 2018-04-15: qty 4

## 2018-04-15 NOTE — ED Triage Notes (Signed)
States he started having abd. Pain with fever and chills , states he has been using "enhancement pills 3-4 times a week , c/o pain in penis ,states he thinks he has a STD

## 2018-04-15 NOTE — Discharge Instructions (Addendum)
Please return on Tuesday so I can go over your lab test.
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
## 2018-04-16 LAB — URINE CULTURE: Culture: NO GROWTH

## 2018-04-16 LAB — HIV ANTIBODY (ROUTINE TESTING W REFLEX): HIV Screen 4th Generation wRfx: NONREACTIVE

## 2018-04-17 LAB — URINE CYTOLOGY ANCILLARY ONLY
Chlamydia: NEGATIVE
Neisseria Gonorrhea: NEGATIVE
Trichomonas: NEGATIVE

## 2018-04-18 ENCOUNTER — Ambulatory Visit (HOSPITAL_COMMUNITY): Admission: EM | Admit: 2018-04-18 | Discharge: 2018-04-18 | Discharge status: Absent without leave | Payer: Self-pay

## 2018-05-19 ENCOUNTER — Ambulatory Visit (INDEPENDENT_AMBULATORY_CARE_PROVIDER_SITE_OTHER): Payer: BLUE CROSS/BLUE SHIELD

## 2018-05-19 ENCOUNTER — Encounter (HOSPITAL_COMMUNITY): Payer: Self-pay | Admitting: Emergency Medicine

## 2018-05-19 ENCOUNTER — Ambulatory Visit (HOSPITAL_COMMUNITY)
Admission: EM | Admit: 2018-05-19 | Discharge: 2018-05-19 | Disposition: A | Discharge status: Home / Self Care | Payer: BLUE CROSS/BLUE SHIELD | Attending: Internal Medicine | Admitting: Internal Medicine

## 2018-05-19 DIAGNOSIS — R0602 Shortness of breath: Secondary | ICD-10-CM

## 2018-05-19 DIAGNOSIS — R0789 Other chest pain: Secondary | ICD-10-CM

## 2018-05-19 DIAGNOSIS — R05 Cough: Secondary | ICD-10-CM | POA: Diagnosis not present

## 2018-05-19 DIAGNOSIS — J111 Influenza due to unidentified influenza virus with other respiratory manifestations: Secondary | ICD-10-CM | POA: Diagnosis not present

## 2018-05-19 LAB — POCT URINALYSIS DIP (DEVICE)
Bilirubin Urine: NEGATIVE
Glucose, UA: NEGATIVE mg/dL
KETONES UR: NEGATIVE mg/dL
LEUKOCYTE UA: NEGATIVE
Nitrite: NEGATIVE
PROTEIN: NEGATIVE mg/dL
Specific Gravity, Urine: 1.02 (ref 1.005–1.030)
UROBILINOGEN UA: 0.2 mg/dL (ref 0.0–1.0)
pH: 5.5 (ref 5.0–8.0)

## 2018-05-19 MED ORDER — TAMSULOSIN HCL 0.4 MG PO CAPS: 0.4000 mg | ORAL_CAPSULE | Freq: Every day | ORAL | 0 refills | Status: DC

## 2018-05-19 MED ORDER — OSELTAMIVIR PHOSPHATE 75 MG PO CAPS: 75.0000 mg | ORAL_CAPSULE | Freq: Two times a day (BID) | ORAL | 0 refills | Status: DC

## 2018-05-19 MED ORDER — ACETAMINOPHEN 325 MG PO TABS
ORAL_TABLET | ORAL | Status: AC
  Filled 2018-05-19: qty 2

## 2018-05-19 NOTE — ED Triage Notes (Signed)
Pt here for fever and body aches  
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
## 2018-05-29 ENCOUNTER — Other Ambulatory Visit: Payer: Self-pay

## 2018-05-29 ENCOUNTER — Ambulatory Visit (HOSPITAL_COMMUNITY)
Admission: EM | Admit: 2018-05-29 | Discharge: 2018-05-29 | Disposition: A | Discharge status: Home / Self Care | Payer: BLUE CROSS/BLUE SHIELD | Attending: Family Medicine | Admitting: Family Medicine

## 2018-05-29 ENCOUNTER — Encounter (HOSPITAL_COMMUNITY): Payer: Self-pay | Admitting: Emergency Medicine

## 2018-05-29 DIAGNOSIS — N410 Acute prostatitis: Secondary | ICD-10-CM | POA: Diagnosis not present

## 2018-05-29 MED ORDER — DOXYCYCLINE HYCLATE 100 MG PO TABS: 100.0000 mg | ORAL_TABLET | Freq: Two times a day (BID) | ORAL | 0 refills | Status: DC

## 2018-05-29 NOTE — ED Triage Notes (Signed)
Chills started today.  Patient was seen 2/14 for fever and body aches.   Patient just not feeling well in general.
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
## 2018-09-18 ENCOUNTER — Other Ambulatory Visit: Payer: Self-pay

## 2018-09-18 ENCOUNTER — Encounter (HOSPITAL_COMMUNITY): Payer: Self-pay

## 2018-09-18 ENCOUNTER — Ambulatory Visit (HOSPITAL_COMMUNITY)
Admission: EM | Admit: 2018-09-18 | Discharge: 2018-09-18 | Disposition: A | Discharge status: Home / Self Care | Payer: BC Managed Care – PPO | Attending: Family Medicine | Admitting: Family Medicine

## 2018-09-18 DIAGNOSIS — Z0289 Encounter for other administrative examinations: Secondary | ICD-10-CM | POA: Diagnosis not present
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
## 2018-09-18 NOTE — ED Triage Notes (Signed)
Patient presents to Urgent Care with complaints of needing a note to return to work since calling out of work last week. Patient reports he has no symptoms at this time and is ready to return to work.

## 2018-10-08 ENCOUNTER — Other Ambulatory Visit: Payer: Self-pay

## 2018-10-08 ENCOUNTER — Emergency Department (HOSPITAL_COMMUNITY)
Admission: EM | Admit: 2018-10-08 | Discharge: 2018-10-08 | Discharge status: Absent without leave | Payer: BC Managed Care – PPO

## 2018-11-28 ENCOUNTER — Emergency Department (HOSPITAL_COMMUNITY): Payer: BC Managed Care – PPO

## 2018-11-28 ENCOUNTER — Emergency Department (HOSPITAL_COMMUNITY)
Admission: EM | Admit: 2018-11-28 | Discharge: 2018-11-28 | Disposition: A | Discharge status: Home / Self Care | Payer: BC Managed Care – PPO | Attending: Emergency Medicine | Admitting: Emergency Medicine

## 2018-11-28 ENCOUNTER — Encounter (HOSPITAL_COMMUNITY): Payer: Self-pay | Admitting: Emergency Medicine

## 2018-11-28 DIAGNOSIS — R0789 Other chest pain: Secondary | ICD-10-CM | POA: Diagnosis not present

## 2018-11-28 DIAGNOSIS — Z79899 Other long term (current) drug therapy: Secondary | ICD-10-CM | POA: Diagnosis not present

## 2018-11-28 DIAGNOSIS — R079 Chest pain, unspecified: Secondary | ICD-10-CM | POA: Diagnosis present

## 2018-11-28 DIAGNOSIS — F17210 Nicotine dependence, cigarettes, uncomplicated: Secondary | ICD-10-CM | POA: Diagnosis not present

## 2018-11-28 LAB — TROPONIN I (HIGH SENSITIVITY)
Troponin I (High Sensitivity): 10 ng/L (ref <18)
Troponin I (High Sensitivity): 8 ng/L (ref <18)

## 2018-11-28 LAB — CBC
HCT: 41.2 % (ref 39.0–52.0)
Hemoglobin: 13.6 g/dL (ref 13.0–17.0)
MCH: 31.8 pg (ref 26.0–34.0)
MCHC: 33 g/dL (ref 30.0–36.0)
MCV: 96.3 fL (ref 80.0–100.0)
Platelets: 266 10*3/uL (ref 150–400)
RBC: 4.28 MIL/uL (ref 4.22–5.81)
RDW: 13 % (ref 11.5–15.5)
WBC: 8.6 10*3/uL (ref 4.0–10.5)
nRBC: 0 % (ref 0.0–0.2)

## 2018-11-28 LAB — BASIC METABOLIC PANEL
Anion gap: 12 (ref 5–15)
BUN: 7 mg/dL (ref 6–20)
CO2: 21 mmol/L — ABNORMAL LOW (ref 22–32)
Calcium: 9.3 mg/dL (ref 8.9–10.3)
Chloride: 103 mmol/L (ref 98–111)
Creatinine, Ser: 0.93 mg/dL (ref 0.61–1.24)
GFR calc Af Amer: >60 mL/min (ref >60)
GFR calc non Af Amer: >60 mL/min (ref >60)
Glucose, Bld: 106 mg/dL — ABNORMAL HIGH (ref 70–99)
Potassium: 3.6 mmol/L (ref 3.5–5.1)
Sodium: 136 mmol/L (ref 135–145)

## 2018-11-28 MED ORDER — ONDANSETRON 4 MG PO TBDP
4.0000 mg | ORAL_TABLET | Freq: Once | ORAL | Status: AC
  Administered 2018-11-28: 4 mg via ORAL
  Filled 2018-11-28: qty 1

## 2018-11-28 MED ORDER — LIDOCAINE VISCOUS HCL 2 % MT SOLN
15.0000 mL | Freq: Once | OROMUCOSAL | Status: AC
  Administered 2018-11-28: 14:00:00 15 mL via ORAL
  Filled 2018-11-28: qty 15

## 2018-11-28 MED ORDER — SODIUM CHLORIDE 0.9% FLUSH: 3.0000 mL | Freq: Once | INTRAVENOUS | Status: DC

## 2018-11-28 MED ORDER — ALUM & MAG HYDROXIDE-SIMETH 200-200-20 MG/5ML PO SUSP
30.0000 mL | Freq: Once | ORAL | Status: AC
  Administered 2018-11-28: 14:00:00 30 mL via ORAL
  Filled 2018-11-28: qty 30

## 2018-11-28 NOTE — Discharge Instructions (Addendum)
Read instructions below for reasons to return to the Emergency Department. It is recommended that your follow up with your Primary Care Doctor in regards to today's visit. If you do not have a doctor, use the resource guide to help you find one.   Your chest xray today shows potential left lobe pulmonary nodule. Recommend CT of the thorax without contrast. Please follow up with primary care doctor for this.   **For heart burn you can take over the counter medications such as Tum or Maalox.  Tests performed today include: An EKG of your heart A chest x-ray Cardiac enzymes - a blood test for heart muscle damage Blood counts and electrolytes  Chest Pain (Nonspecific)  HOME CARE INSTRUCTIONS  -For the next few days, avoid physical activities that bring on chest pain. Continue physical activities as directed.  -Do not smoke cigarettes or drink alcohol until your symptoms are gone. If you do smoke, it is time to quit. You may receive instructions and counseling on how to stop smoking. Only take over-the-counter or prescription medicine for pain, discomfort, or fever as directed by your caregiver.  -Follow your caregiver's suggestions for further testing if your chest pain does not go away.  -Keep any follow-up appointments you made. If you do not go to an appointment, you could develop lasting (chronic) problems with pain. If there is any problem keeping an appointment, you must call to reschedule.   SEEK MEDICAL CARE IF:  You think you are having problems from the medicine you are taking. Read your medicine instructions carefully.  Your chest pain does not go away, even after treatment.  You develop a rash with blisters on your chest.   SEEK IMMEDIATE MEDICAL CARE IF:  You have increased chest pain or pain that spreads to your arm, neck, jaw, back, or belly (abdomen).  You develop shortness of breath, an increasing cough, or you are coughing up blood.  You have severe back or abdominal pain,  feel sick to your stomach (nauseous) or throw up (vomit).  You develop severe weakness, fainting, or chills.  You have an oral temperature above 102 F (38.9 C), not controlled by medicine.   THIS IS AN EMERGENCY. Do not wait to see if the pain will go away. Get medical help at once. Call 911. Do not drive yourself to the hospital.

## 2018-11-28 NOTE — ED Notes (Signed)
Patient verbalizes understanding of discharge instructions . Opportunity for questions and answers were provided . Armband removed by staff ,Pt discharged from ED. W/C  offered at D/C  and Declined W/C at D/C and was escorted to lobby by RN.  

## 2018-11-28 NOTE — ED Triage Notes (Signed)
Pt arrives via [facility] from home with c/o of knot in chest and severe indigestion. Pt has had constant belching- with a feeling of something being stuck in his chest.

## 2018-11-28 NOTE — ED Notes (Signed)
Pt sat up in bed and said he was having bad chest pain in mid sternal area and then started belching a few times.
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
## 2019-06-19 ENCOUNTER — Ambulatory Visit (HOSPITAL_COMMUNITY)
Admission: EM | Admit: 2019-06-19 | Discharge: 2019-06-19 | Disposition: A | Discharge status: Home / Self Care | Payer: BC Managed Care – PPO | Attending: Family Medicine | Admitting: Family Medicine

## 2019-06-19 ENCOUNTER — Encounter (HOSPITAL_COMMUNITY): Payer: Self-pay | Admitting: Emergency Medicine

## 2019-06-19 ENCOUNTER — Other Ambulatory Visit: Payer: Self-pay

## 2019-06-19 DIAGNOSIS — Z202 Contact with and (suspected) exposure to infections with a predominantly sexual mode of transmission: Secondary | ICD-10-CM

## 2019-06-19 DIAGNOSIS — L0291 Cutaneous abscess, unspecified: Secondary | ICD-10-CM

## 2019-06-19 DIAGNOSIS — G8929 Other chronic pain: Secondary | ICD-10-CM

## 2019-06-19 DIAGNOSIS — M25511 Pain in right shoulder: Secondary | ICD-10-CM | POA: Insufficient documentation

## 2019-06-19 LAB — HIV ANTIBODY (ROUTINE TESTING W REFLEX): HIV Screen 4th Generation wRfx: NONREACTIVE

## 2019-06-19 MED ORDER — CYCLOBENZAPRINE HCL 10 MG PO TABS: 10.0000 mg | ORAL_TABLET | Freq: Two times a day (BID) | ORAL | 0 refills | Status: DC | PRN

## 2019-06-19 MED ORDER — IBUPROFEN 800 MG PO TABS: 800.0000 mg | ORAL_TABLET | Freq: Three times a day (TID) | ORAL | 0 refills | Status: DC | PRN
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
## 2019-06-19 NOTE — ED Triage Notes (Signed)
Pt here for right shoulder pain chronic in nature but worse x 1 week; pt also would like to have area on the arm looked at that may be a cyst

## 2019-06-19 NOTE — Discharge Instructions (Addendum)
   Your STD tests are pending.  If your test results are positive, we will call you.  You may need additional treatment and your partner(s) may also need treatment.   I have sent prescription strength ibuprofen to your pharmacy.  I have also sent a muscle relaxer to help you at night with your shoulder.  Follow-up with orthopedics as soon as you get insurance, that shoulder probably really needs to be repaired surgically.

## 2019-06-20 LAB — CYTOLOGY, (ORAL, ANAL, URETHRAL) ANCILLARY ONLY
Chlamydia: NEGATIVE
Neisseria Gonorrhea: NEGATIVE
Trichomonas: POSITIVE — AB

## 2019-06-20 LAB — RPR: RPR Ser Ql: NONREACTIVE

## 2019-06-22 ENCOUNTER — Telehealth (HOSPITAL_COMMUNITY): Payer: Self-pay | Admitting: *Deleted

## 2019-06-22 MED ORDER — METRONIDAZOLE 500 MG PO TABS: 500.0000 mg | ORAL_TABLET | Freq: Two times a day (BID) | ORAL | 0 refills | Status: DC

## 2019-06-22 NOTE — Telephone Encounter (Signed)
Trichomonas is positive. Patient will need rx for Flagyl 500mg  BID x 7 days sent to pharmacy of choice.  Patient called left VM.

## 2019-06-22 NOTE — Telephone Encounter (Signed)
Trichomonas is positive. Rx  for Flagyl 500mg BID x 7 days was sent to the pharmacy of record. Pt needs education to refrain from sexual intercourse for 7 days to give the medicine time to work. Sexual partners need to be notified and tested/treated. Condoms may reduce risk of reinfection. Recheck for further evaluation if symptoms are not improving.  

## 2019-06-26 ENCOUNTER — Emergency Department (HOSPITAL_COMMUNITY): Payer: Self-pay

## 2019-06-26 ENCOUNTER — Other Ambulatory Visit: Payer: Self-pay

## 2019-06-26 ENCOUNTER — Encounter (HOSPITAL_COMMUNITY): Payer: Self-pay | Admitting: Emergency Medicine

## 2019-06-26 ENCOUNTER — Emergency Department (HOSPITAL_COMMUNITY)
Admission: EM | Admit: 2019-06-26 | Discharge: 2019-06-26 | Disposition: A | Discharge status: Home / Self Care | Payer: Self-pay | Attending: Emergency Medicine | Admitting: Emergency Medicine

## 2019-06-26 DIAGNOSIS — N400 Enlarged prostate without lower urinary tract symptoms: Secondary | ICD-10-CM | POA: Insufficient documentation

## 2019-06-26 DIAGNOSIS — M545 Low back pain, unspecified: Secondary | ICD-10-CM

## 2019-06-26 DIAGNOSIS — F17210 Nicotine dependence, cigarettes, uncomplicated: Secondary | ICD-10-CM | POA: Insufficient documentation

## 2019-06-26 DIAGNOSIS — R1030 Lower abdominal pain, unspecified: Secondary | ICD-10-CM | POA: Insufficient documentation

## 2019-06-26 LAB — COMPREHENSIVE METABOLIC PANEL
ALT: 11 U/L (ref 0–44)
AST: 18 U/L (ref 15–41)
Albumin: 3.6 g/dL (ref 3.5–5.0)
Alkaline Phosphatase: 48 U/L (ref 38–126)
Anion gap: 7 (ref 5–15)
BUN: 9 mg/dL (ref 6–20)
CO2: 23 mmol/L (ref 22–32)
Calcium: 8.7 mg/dL — ABNORMAL LOW (ref 8.9–10.3)
Chloride: 105 mmol/L (ref 98–111)
Creatinine, Ser: 0.75 mg/dL (ref 0.61–1.24)
GFR calc Af Amer: >60 mL/min (ref >60)
GFR calc non Af Amer: >60 mL/min (ref >60)
Glucose, Bld: 100 mg/dL — ABNORMAL HIGH (ref 70–99)
Potassium: 3.6 mmol/L (ref 3.5–5.1)
Sodium: 135 mmol/L (ref 135–145)
Total Bilirubin: 0.8 mg/dL (ref 0.3–1.2)
Total Protein: 6.7 g/dL (ref 6.5–8.1)

## 2019-06-26 LAB — URINALYSIS, ROUTINE W REFLEX MICROSCOPIC
Bacteria, UA: NONE SEEN
Bilirubin Urine: NEGATIVE
Glucose, UA: NEGATIVE mg/dL
Hgb urine dipstick: NEGATIVE
Ketones, ur: NEGATIVE mg/dL
Nitrite: NEGATIVE
Protein, ur: NEGATIVE mg/dL
Specific Gravity, Urine: 1.008 (ref 1.005–1.030)
pH: 5 (ref 5.0–8.0)

## 2019-06-26 LAB — LIPASE, BLOOD: Lipase: 20 U/L (ref 11–51)

## 2019-06-26 LAB — CBC
HCT: 38.7 % — ABNORMAL LOW (ref 39.0–52.0)
Hemoglobin: 12.7 g/dL — ABNORMAL LOW (ref 13.0–17.0)
MCH: 31.6 pg (ref 26.0–34.0)
MCHC: 32.8 g/dL (ref 30.0–36.0)
MCV: 96.3 fL (ref 80.0–100.0)
Platelets: 194 10*3/uL (ref 150–400)
RBC: 4.02 MIL/uL — ABNORMAL LOW (ref 4.22–5.81)
RDW: 12.5 % (ref 11.5–15.5)
WBC: 4.8 10*3/uL (ref 4.0–10.5)
nRBC: 0 % (ref 0.0–0.2)

## 2019-06-26 MED ORDER — KETOROLAC TROMETHAMINE 15 MG/ML IJ SOLN
15.0000 mg | Freq: Once | INTRAMUSCULAR | Status: AC
  Administered 2019-06-26: 15 mg via INTRAVENOUS
  Filled 2019-06-26: qty 1

## 2019-06-26 MED ORDER — MORPHINE SULFATE (PF) 4 MG/ML IV SOLN
4.0000 mg | Freq: Once | INTRAVENOUS | Status: AC
  Administered 2019-06-26: 07:00:00 4 mg via INTRAVENOUS
  Filled 2019-06-26: qty 1

## 2019-06-26 MED ORDER — SODIUM CHLORIDE 0.9 % IV BOLUS
1000.0000 mL | Freq: Once | INTRAVENOUS | Status: AC
  Administered 2019-06-26: 06:00:00 1000 mL via INTRAVENOUS

## 2019-06-26 MED ORDER — SODIUM CHLORIDE 0.9% FLUSH
3.0000 mL | Freq: Once | INTRAVENOUS | Status: AC
  Administered 2019-06-26: 06:00:00 3 mL via INTRAVENOUS

## 2019-06-26 MED ORDER — IOHEXOL 300 MG/ML  SOLN
100.0000 mL | Freq: Once | INTRAMUSCULAR | Status: AC | PRN
  Administered 2019-06-26: 100 mL via INTRAVENOUS

## 2019-06-26 MED ORDER — SODIUM CHLORIDE (PF) 0.9 % IJ SOLN
INTRAMUSCULAR | Status: AC
  Filled 2019-06-26: qty 50

## 2019-06-26 MED ORDER — TAMSULOSIN HCL 0.4 MG PO CAPS: 0.4000 mg | ORAL_CAPSULE | Freq: Every day | ORAL | 0 refills | Status: DC

## 2019-06-26 MED ORDER — ONDANSETRON HCL 4 MG/2ML IJ SOLN
4.0000 mg | Freq: Once | INTRAMUSCULAR | Status: AC
  Administered 2019-06-26: 4 mg via INTRAVENOUS
  Filled 2019-06-26: qty 2

## 2019-06-26 NOTE — ED Notes (Signed)
Pt provided with urinal at this time

## 2019-06-26 NOTE — ED Notes (Signed)
EDP to pt bedside.  

## 2019-06-26 NOTE — ED Triage Notes (Signed)
Pt arrived from home via EMS. Pt reports severe left lower abdominal pain and right lower abdominal pain. Pt has lower back pain through the muscles of his low back. Pt's pain has been going on for about a day and he reports trouble with bowel movements and with urination. No hx of kidney stones. Pt reports taking male enhancement pills, with the last one being noon 06/25/19.

## 2019-06-26 NOTE — Discharge Instructions (Signed)
You can take ibuprofen and tylenol, available over the counter according to label instructions as needed for back pain.    You had a CT scan today that showed your prostate is enlarged.  Please follow up with the Urologist for further evaluation.
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
## 2019-06-26 NOTE — ED Notes (Signed)
Pt requesting update MD aware.  

## 2019-06-26 NOTE — ED Notes (Signed)
Pt ambulated out without difficulty

## 2019-09-12 ENCOUNTER — Emergency Department (HOSPITAL_COMMUNITY): Payer: Self-pay

## 2019-09-12 ENCOUNTER — Emergency Department (HOSPITAL_COMMUNITY)
Admission: EM | Admit: 2019-09-12 | Discharge: 2019-09-12 | Disposition: A | Discharge status: Home / Self Care | Payer: Self-pay | Attending: Emergency Medicine | Admitting: Emergency Medicine

## 2019-09-12 ENCOUNTER — Other Ambulatory Visit: Payer: Self-pay

## 2019-09-12 ENCOUNTER — Encounter (HOSPITAL_COMMUNITY): Payer: Self-pay | Admitting: Emergency Medicine

## 2019-09-12 DIAGNOSIS — E7220 Disorder of urea cycle metabolism, unspecified: Secondary | ICD-10-CM | POA: Insufficient documentation

## 2019-09-12 DIAGNOSIS — F17210 Nicotine dependence, cigarettes, uncomplicated: Secondary | ICD-10-CM | POA: Insufficient documentation

## 2019-09-12 DIAGNOSIS — R410 Disorientation, unspecified: Secondary | ICD-10-CM

## 2019-09-12 DIAGNOSIS — R519 Headache, unspecified: Secondary | ICD-10-CM | POA: Insufficient documentation

## 2019-09-12 LAB — COMPREHENSIVE METABOLIC PANEL
ALT: 14 U/L (ref 0–44)
AST: 25 U/L (ref 15–41)
Albumin: 3.5 g/dL (ref 3.5–5.0)
Alkaline Phosphatase: 41 U/L (ref 38–126)
Anion gap: 12 (ref 5–15)
BUN: 15 mg/dL (ref 6–20)
CO2: 20 mmol/L — ABNORMAL LOW (ref 22–32)
Calcium: 8.5 mg/dL — ABNORMAL LOW (ref 8.9–10.3)
Chloride: 100 mmol/L (ref 98–111)
Creatinine, Ser: 0.96 mg/dL (ref 0.61–1.24)
GFR calc Af Amer: >60 mL/min (ref >60)
GFR calc non Af Amer: >60 mL/min (ref >60)
Glucose, Bld: 146 mg/dL — ABNORMAL HIGH (ref 70–99)
Potassium: 3.4 mmol/L — ABNORMAL LOW (ref 3.5–5.1)
Sodium: 132 mmol/L — ABNORMAL LOW (ref 135–145)
Total Bilirubin: 0.6 mg/dL (ref 0.3–1.2)
Total Protein: 6.1 g/dL — ABNORMAL LOW (ref 6.5–8.1)

## 2019-09-12 LAB — RAPID URINE DRUG SCREEN, HOSP PERFORMED
Amphetamines: NOT DETECTED
Barbiturates: NOT DETECTED
Benzodiazepines: NOT DETECTED
Cocaine: POSITIVE — AB
Opiates: NOT DETECTED
Tetrahydrocannabinol: POSITIVE — AB

## 2019-09-12 LAB — CBC
HCT: 35.3 % — ABNORMAL LOW (ref 39.0–52.0)
Hemoglobin: 11.6 g/dL — ABNORMAL LOW (ref 13.0–17.0)
MCH: 31.9 pg (ref 26.0–34.0)
MCHC: 32.9 g/dL (ref 30.0–36.0)
MCV: 97 fL (ref 80.0–100.0)
Platelets: 176 10*3/uL (ref 150–400)
RBC: 3.64 MIL/uL — ABNORMAL LOW (ref 4.22–5.81)
RDW: 13.7 % (ref 11.5–15.5)
WBC: 4.6 10*3/uL (ref 4.0–10.5)
nRBC: 0 % (ref 0.0–0.2)

## 2019-09-12 LAB — ETHANOL: Alcohol, Ethyl (B): <10 mg/dL (ref <10)

## 2019-09-12 LAB — AMMONIA: Ammonia: 80 umol/L — ABNORMAL HIGH (ref 9–35)

## 2019-09-12 MED ORDER — SODIUM CHLORIDE 0.9 % IV BOLUS
1000.0000 mL | Freq: Once | INTRAVENOUS | Status: AC
  Administered 2019-09-12: 1000 mL via INTRAVENOUS

## 2019-09-12 MED ORDER — THIAMINE HCL 100 MG/ML IJ SOLN
100.0000 mg | Freq: Once | INTRAMUSCULAR | Status: AC
  Administered 2019-09-12: 100 mg via INTRAVENOUS
  Filled 2019-09-12: qty 2

## 2019-09-12 NOTE — ED Notes (Signed)
Patient verbalizes understanding of discharge instructions. Opportunity for questioning and answers were provided. Armband removed by staff, pt discharged from ED stable & ambulatory  
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
## 2019-09-12 NOTE — ED Notes (Signed)
Pt to CT via stretcher

## 2019-09-12 NOTE — Discharge Instructions (Signed)
Please read and follow all provided instructions.  Your diagnoses today include:  1. Hyperammonemia (HCC)   2. Disorientation     Tests performed today include:  Blood counts and electrolytes  CT of your head -did not show stroke or other problems  Ammonia level -this was high, when this is high it can cause confusion  Vital signs. See below for your results today.   Medications prescribed:   None  Take any prescribed medications only as directed.  Home care instructions:  Follow any educational materials contained in this packet.  BE VERY CAREFUL not to take multiple medicines containing Tylenol (also called acetaminophen). Doing so can lead to an overdose which can damage your liver and cause liver failure and possibly death.   Follow-up instructions: Please follow-up with the primary care referral listed.  Return instructions:   Please return to the Emergency Department if you experience worsening symptoms.   Return IMMEDIATELY if you have weakness in your arms or legs, slurred speech, trouble walking or talking, confusion, or trouble with your balance.   Please return if you have any other emergent concerns.  Additional Information:  Your vital signs today were: BP 116/70   Pulse (!) 58   Temp 98.6 F (37 C) (Oral)   Resp 19   SpO2 96%  If your blood pressure (BP) was elevated above 135/85 this visit, please have this repeated by your doctor within one month. --------------

## 2019-09-12 NOTE — ED Triage Notes (Addendum)
Pt here ems. Per pts sister he got off of work came home and wasn't making since to her, and was sweaty. Pts sister made him some food to eat thinking maybe he was have low blood sugar or dehydrated. Pt drank two 40oz beers upon arrival to home. Pt denies LOC or Pain. Pt states he is fine. Pt works is a Naval architect with big fans. EMS gave of NS. BP 111/64

## 2019-10-03 ENCOUNTER — Other Ambulatory Visit: Payer: Self-pay

## 2019-10-03 ENCOUNTER — Ambulatory Visit (INDEPENDENT_AMBULATORY_CARE_PROVIDER_SITE_OTHER): Payer: Self-pay

## 2019-10-03 ENCOUNTER — Encounter (HOSPITAL_COMMUNITY): Payer: Self-pay

## 2019-10-03 ENCOUNTER — Ambulatory Visit (HOSPITAL_COMMUNITY)
Admission: EM | Admit: 2019-10-03 | Discharge: 2019-10-03 | Disposition: A | Discharge status: Home / Self Care | Payer: Self-pay | Attending: Internal Medicine | Admitting: Internal Medicine

## 2019-10-03 DIAGNOSIS — S299XXA Unspecified injury of thorax, initial encounter: Secondary | ICD-10-CM

## 2019-10-03 DIAGNOSIS — S134XXA Sprain of ligaments of cervical spine, initial encounter: Secondary | ICD-10-CM

## 2019-10-03 DIAGNOSIS — S161XXA Strain of muscle, fascia and tendon at neck level, initial encounter: Secondary | ICD-10-CM | POA: Insufficient documentation

## 2019-10-03 DIAGNOSIS — S39012A Strain of muscle, fascia and tendon of lower back, initial encounter: Secondary | ICD-10-CM | POA: Insufficient documentation
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
## 2019-10-03 NOTE — Discharge Instructions (Addendum)
Your xrays look good today. Go to ER for any severe headache or vomiting. Use Motrin as needed for pain. Heat on low back will also be helpful. Follow up with [facility] if needed.

## 2019-10-04 LAB — CYTOLOGY, (ORAL, ANAL, URETHRAL) ANCILLARY ONLY
Chlamydia: NEGATIVE
Comment: NEGATIVE
Comment: NEGATIVE
Comment: NORMAL
Neisseria Gonorrhea: NEGATIVE
Trichomonas: NEGATIVE

## 2020-02-22 ENCOUNTER — Encounter (HOSPITAL_COMMUNITY): Payer: Self-pay | Admitting: Emergency Medicine

## 2020-02-22 ENCOUNTER — Emergency Department (HOSPITAL_COMMUNITY)
Admission: EM | Admit: 2020-02-22 | Discharge: 2020-02-22 | Disposition: A | Discharge status: Home / Self Care | Payer: Self-pay | Attending: Emergency Medicine | Admitting: Emergency Medicine

## 2020-02-22 DIAGNOSIS — M25512 Pain in left shoulder: Secondary | ICD-10-CM | POA: Insufficient documentation

## 2020-02-22 DIAGNOSIS — M79605 Pain in left leg: Secondary | ICD-10-CM | POA: Insufficient documentation

## 2020-02-22 DIAGNOSIS — Y92410 Unspecified street and highway as the place of occurrence of the external cause: Secondary | ICD-10-CM | POA: Insufficient documentation

## 2020-02-22 DIAGNOSIS — M542 Cervicalgia: Secondary | ICD-10-CM | POA: Insufficient documentation

## 2020-02-22 DIAGNOSIS — Z5321 Procedure and treatment not carried out due to patient leaving prior to being seen by health care provider: Secondary | ICD-10-CM | POA: Insufficient documentation
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
## 2020-04-01 ENCOUNTER — Other Ambulatory Visit: Payer: Self-pay

## 2020-04-01 ENCOUNTER — Emergency Department (HOSPITAL_COMMUNITY)
Admission: EM | Admit: 2020-04-01 | Discharge: 2020-04-02 | Disposition: A | Discharge status: Home / Self Care | Payer: Self-pay | Attending: Emergency Medicine | Admitting: Emergency Medicine

## 2020-04-01 DIAGNOSIS — F17210 Nicotine dependence, cigarettes, uncomplicated: Secondary | ICD-10-CM | POA: Insufficient documentation

## 2020-04-01 DIAGNOSIS — R112 Nausea with vomiting, unspecified: Secondary | ICD-10-CM | POA: Insufficient documentation

## 2020-04-01 DIAGNOSIS — R1030 Lower abdominal pain, unspecified: Secondary | ICD-10-CM | POA: Insufficient documentation

## 2020-04-01 LAB — CBC WITH DIFFERENTIAL/PLATELET
Abs Immature Granulocytes: 0.03 10*3/uL (ref 0.00–0.07)
Basophils Absolute: 0 10*3/uL (ref 0.0–0.1)
Basophils Relative: 0 %
Eosinophils Absolute: 0 10*3/uL (ref 0.0–0.5)
Eosinophils Relative: 0 %
HCT: 42.4 % (ref 39.0–52.0)
Hemoglobin: 14.3 g/dL (ref 13.0–17.0)
Immature Granulocytes: 1 %
Lymphocytes Relative: 23 %
Lymphs Abs: 1.5 10*3/uL (ref 0.7–4.0)
MCH: 31.8 pg (ref 26.0–34.0)
MCHC: 33.7 g/dL (ref 30.0–36.0)
MCV: 94.2 fL (ref 80.0–100.0)
Monocytes Absolute: 0.3 10*3/uL (ref 0.1–1.0)
Monocytes Relative: 4 %
Neutro Abs: 4.7 10*3/uL (ref 1.7–7.7)
Neutrophils Relative %: 72 %
Platelets: 240 10*3/uL (ref 150–400)
RBC: 4.5 MIL/uL (ref 4.22–5.81)
RDW: 13.2 % (ref 11.5–15.5)
WBC: 6.5 10*3/uL (ref 4.0–10.5)
nRBC: 0 % (ref 0.0–0.2)

## 2020-04-01 LAB — URINALYSIS, ROUTINE W REFLEX MICROSCOPIC
Bacteria, UA: NONE SEEN
Bilirubin Urine: NEGATIVE
Glucose, UA: NEGATIVE mg/dL
Ketones, ur: 5 mg/dL — AB
Leukocytes,Ua: NEGATIVE
Nitrite: NEGATIVE
Protein, ur: NEGATIVE mg/dL
Specific Gravity, Urine: 1.02 (ref 1.005–1.030)
pH: 5 (ref 5.0–8.0)

## 2020-04-01 LAB — COMPREHENSIVE METABOLIC PANEL
ALT: 14 U/L (ref 0–44)
AST: 23 U/L (ref 15–41)
Albumin: 3.9 g/dL (ref 3.5–5.0)
Alkaline Phosphatase: 56 U/L (ref 38–126)
Anion gap: 11 (ref 5–15)
BUN: 8 mg/dL (ref 6–20)
CO2: 20 mmol/L — ABNORMAL LOW (ref 22–32)
Calcium: 9.5 mg/dL (ref 8.9–10.3)
Chloride: 104 mmol/L (ref 98–111)
Creatinine, Ser: 0.84 mg/dL (ref 0.61–1.24)
GFR, Estimated: >60 mL/min (ref >60)
Glucose, Bld: 132 mg/dL — ABNORMAL HIGH (ref 70–99)
Potassium: 3.8 mmol/L (ref 3.5–5.1)
Sodium: 135 mmol/L (ref 135–145)
Total Bilirubin: 0.7 mg/dL (ref 0.3–1.2)
Total Protein: 7.3 g/dL (ref 6.5–8.1)

## 2020-04-01 LAB — ETHANOL: Alcohol, Ethyl (B): <10 mg/dL (ref <10)

## 2020-04-01 LAB — LIPASE, BLOOD: Lipase: 21 U/L (ref 11–51)

## 2020-04-01 MED ORDER — PANTOPRAZOLE SODIUM 40 MG IV SOLR
40.0000 mg | Freq: Once | INTRAVENOUS | Status: AC
  Administered 2020-04-01: 40 mg via INTRAVENOUS
  Filled 2020-04-01: qty 40

## 2020-04-01 MED ORDER — FENTANYL CITRATE (PF) 100 MCG/2ML IJ SOLN
50.0000 ug | Freq: Once | INTRAMUSCULAR | Status: AC
  Administered 2020-04-01: 50 ug via INTRAVENOUS
  Filled 2020-04-01: qty 2

## 2020-04-01 NOTE — ED Notes (Signed)
Pt refuse to answer anymore screening questions from this RN. Pt being irate, screaming and moaning in hallway. Pt being verbally aggressive towards staff.  Explained to patient importance of talking to RN in order to assess him properly.   Pt refusing to talk to other staff and only wants to speak to provider.  Pt cussing out in hallway.

## 2020-04-01 NOTE — ED Triage Notes (Signed)
Brought in by [facility] from [place],  abdominal pain, n/v and headache since 10am this morning. Tried taking BC powder and made everything worse.   Having dry heaving and hypertensive with EMS.  Does not see a PCP. Hx of alcohol abuse.   zofran 4mg  IV given.

## 2020-04-01 NOTE — Discharge Instructions (Signed)
Recommend taking Prilosec daily (found over-the-counter) to prevent further pain.   Follow up with a primary care provider if your pain is recurrent. If you do not have a primary care doctor, you can call the [facility].   If your pain becomes severe, if you develop a fever, have bloody stools or vomiting, please return to the emergency department.

## 2020-04-01 NOTE — ED Notes (Signed)
Seen pt making a facebook live video and pt shouted at this RN stating "what fucking business is it with you?!"

## 2020-04-01 NOTE — ED Notes (Signed)
Seen pt being anxious - tried to reposition pt for comfort and safety as pt was sitting on edge of stretcher - pt became angry and states staff was being mean to him.   Explained to patient that we need doctors orders for pain meds. Pt reports having head and abdominal pain and does not want to answer this RN.
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
## 2020-04-01 NOTE — ED Notes (Signed)
Pt able to calm down with explanation of plan (labs and pain medication), security at bedside due to this RNs request.

## 2021-01-21 IMAGING — DX DG CERVICAL SPINE COMPLETE 4+V
6 series · 6 of 6 positions shown · non-contrast
Comparison: None.

CLINICAL DATA: Trauma neck stiffness

EXAM:
CERVICAL SPINE - COMPLETE 4+ VIEW

[c-spine lat]
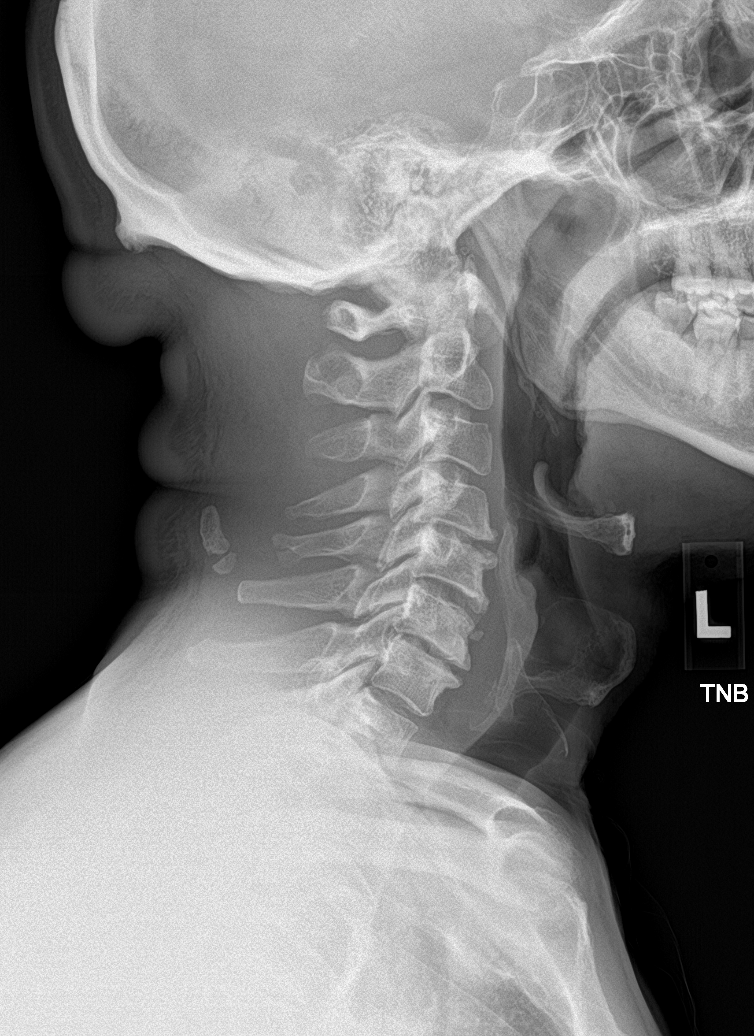

[c-spine obl (1 of 2)]
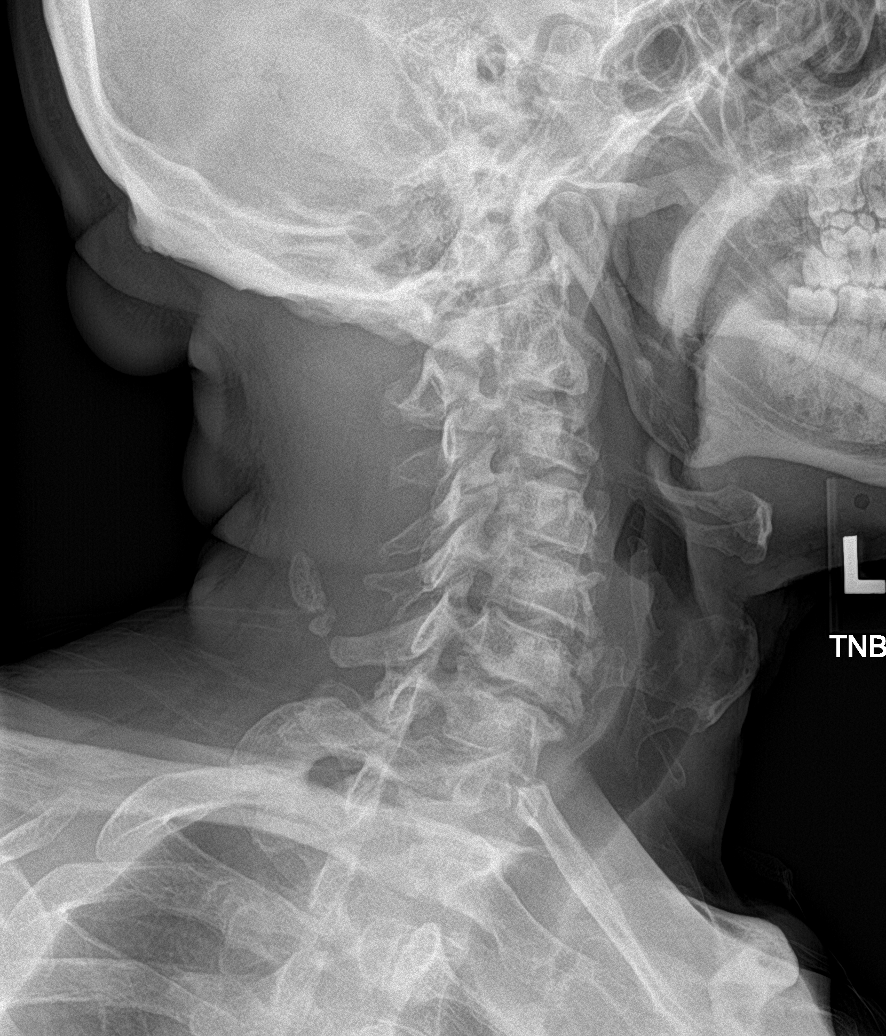

[c-spine obl (2 of 2)]
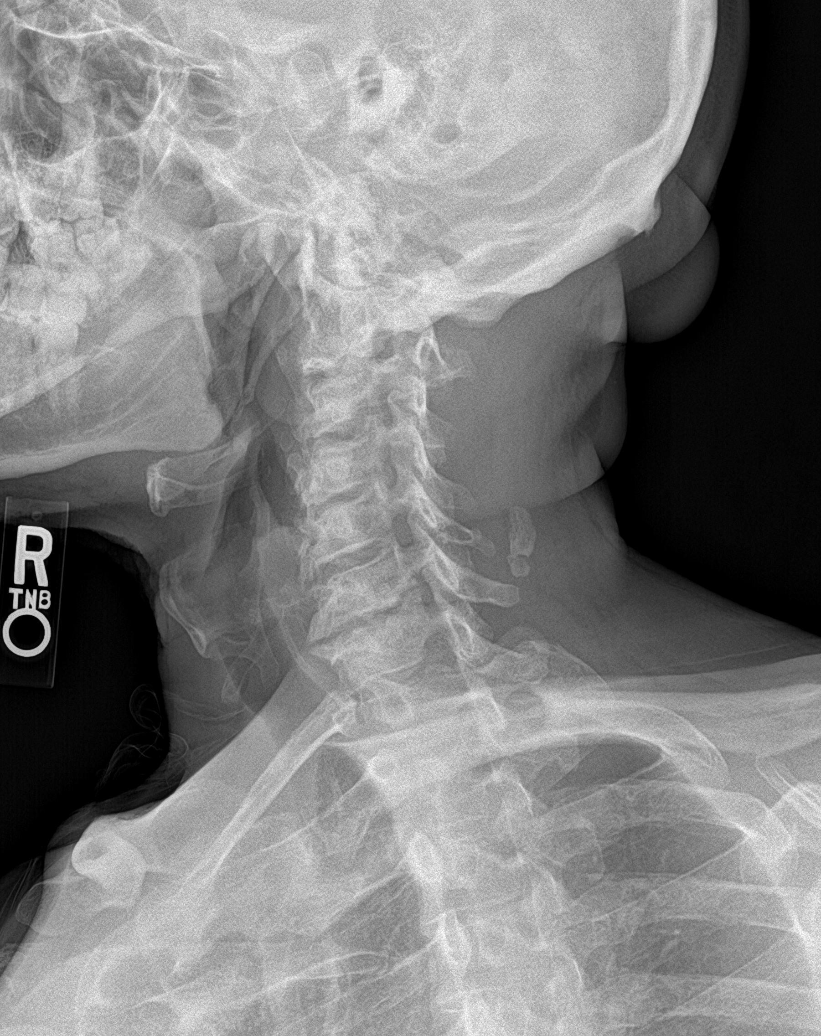

[c-spine ap]
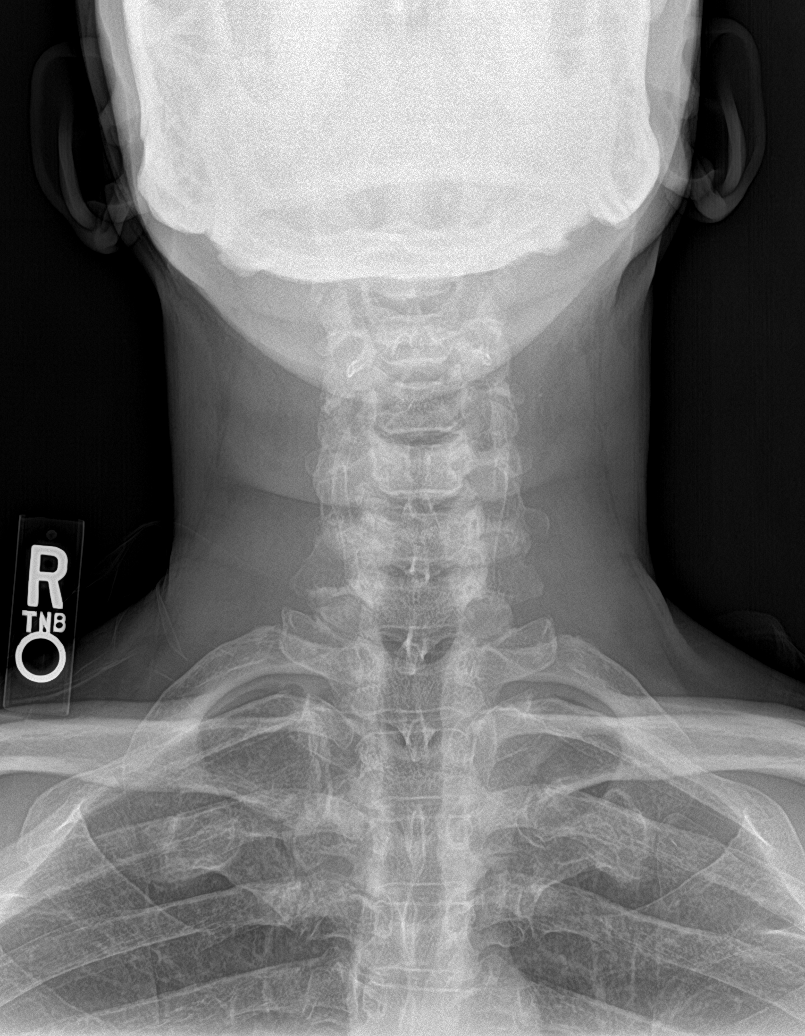

[c-spine swimmers]
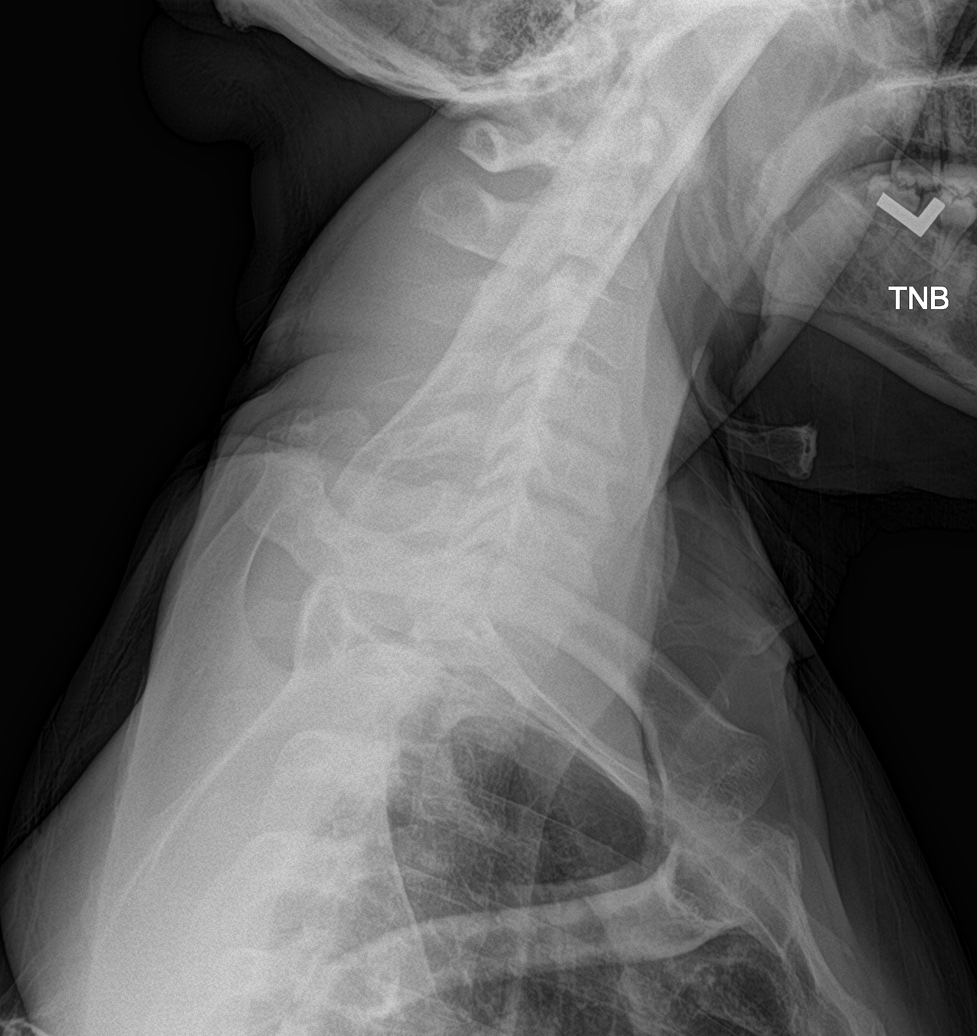

[c-spine open mouth]
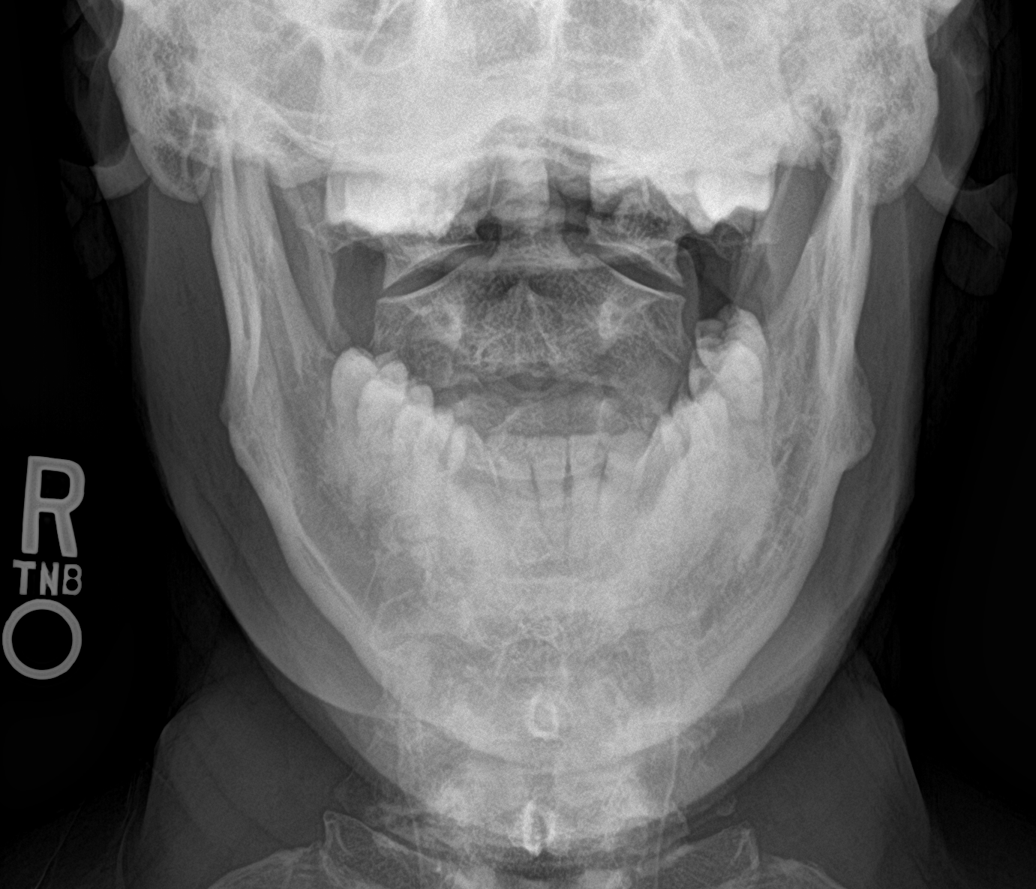

[6 of 6 positions shown; findings below may reference images not displayed]

FINDINGS: Alignment shows trace retrolisthesis C4 on C5. Vertebral body
heights are normal. Normal prevertebral soft tissue thickness.
Moderate degenerative changes C4-C5, C5-C6 and C6-C7. Bilateral
foraminal narrowing of the mid and lower cervical spine. Dens and
lateral masses are within normal limits.
IMPRESSION: Trace retrolisthesis C4 on C5. Moderate degenerative changes C4
through C7. No acute osseous abnormality.

## 2021-10-07 ENCOUNTER — Ambulatory Visit (HOSPITAL_COMMUNITY)
Admission: EM | Admit: 2021-10-07 | Discharge: 2021-10-07 | Disposition: A | Discharge status: Home / Self Care | Payer: Commercial Managed Care - HMO | Attending: Family Medicine | Admitting: Family Medicine

## 2021-10-07 ENCOUNTER — Encounter (HOSPITAL_COMMUNITY): Payer: Self-pay | Admitting: Emergency Medicine

## 2021-10-07 ENCOUNTER — Other Ambulatory Visit: Payer: Self-pay

## 2021-10-07 DIAGNOSIS — L739 Follicular disorder, unspecified: Secondary | ICD-10-CM

## 2021-10-07 MED ORDER — MUPIROCIN 2 % EX OINT: 1.0000 | TOPICAL_OINTMENT | Freq: Two times a day (BID) | CUTANEOUS | 0 refills | Status: AC

## 2021-10-07 MED ORDER — AMOXICILLIN-POT CLAVULANATE 875-125 MG PO TABS: 1.0000 | ORAL_TABLET | Freq: Two times a day (BID) | ORAL | 0 refills | Status: AC

## 2021-10-07 NOTE — ED Triage Notes (Signed)
Noticed areas of rash in beard on center chin, under lower lip and sore on lower lip   noticed these areas 2 weeks ago.  Patient reports feeling knots under chin.  Patient has been using ointments to these areas.

## 2021-10-07 NOTE — Discharge Instructions (Addendum)
Take amoxicillin-clavulanate 875 mg--1 tab twice daily with food for 7 days  Put mupirocin antibiotic ointment on your lip, and on the 2 bumps below your lip, 2 times daily till better  Doing a warm compress on those swollen spots on your chin may help.  Use a QR code/website at the end of the summary paperwork to make you a new patient appointment with a new provider
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
## 2021-12-29 ENCOUNTER — Ambulatory Visit (INDEPENDENT_AMBULATORY_CARE_PROVIDER_SITE_OTHER): Payer: Self-pay

## 2021-12-29 ENCOUNTER — Encounter (HOSPITAL_COMMUNITY): Payer: Self-pay

## 2021-12-29 ENCOUNTER — Ambulatory Visit (HOSPITAL_COMMUNITY)
Admission: EM | Admit: 2021-12-29 | Discharge: 2021-12-29 | Disposition: A | Discharge status: Home / Self Care | Payer: Self-pay | Attending: Emergency Medicine | Admitting: Emergency Medicine

## 2021-12-29 DIAGNOSIS — M25551 Pain in right hip: Secondary | ICD-10-CM

## 2021-12-29 DIAGNOSIS — W19XXXA Unspecified fall, initial encounter: Secondary | ICD-10-CM

## 2021-12-29 DIAGNOSIS — R0781 Pleurodynia: Secondary | ICD-10-CM

## 2021-12-29 DIAGNOSIS — R079 Chest pain, unspecified: Secondary | ICD-10-CM

## 2021-12-29 MED ORDER — CYCLOBENZAPRINE HCL 10 MG PO TABS: 10.0000 mg | ORAL_TABLET | Freq: Every day | ORAL | 0 refills | Status: AC

## 2021-12-29 MED ORDER — KETOROLAC TROMETHAMINE 30 MG/ML IJ SOLN
INTRAMUSCULAR | Status: AC
  Filled 2021-12-29: qty 1

## 2021-12-29 MED ORDER — PREDNISONE 20 MG PO TABS: 40.0000 mg | ORAL_TABLET | Freq: Every day | ORAL | 0 refills | Status: DC

## 2021-12-29 MED ORDER — KETOROLAC TROMETHAMINE 30 MG/ML IJ SOLN
30.0000 mg | Freq: Once | INTRAMUSCULAR | Status: AC
  Administered 2021-12-29: 30 mg via INTRAMUSCULAR

## 2021-12-29 NOTE — Discharge Instructions (Signed)
On your exam there is tenderness directly over your right hip and there is pain over ribs 5 through 7 in your sternum, x-rays show that there is no injury to the bones in either location and there is no injury to the lungs and therefore your symptoms are result of direct injury when he fell and will improve with time  You have been given an injection of Toradol today in office to help reduce inflammation that naturally occurs with injury which in turn will help with your pain  Starting tomorrow take prednisone every morning with food to continue the above process, you may use Tylenol while using this medication  You may use muscle relaxer at bedtime for additional comfort and to allow you to rest, be mindful this medication may make you drowsy  You may use ice or heat over the affected areas in 10 to 15-minute intervals   you may use massage and stretch as tolerated  May continue activity as tolerated  If for any reason your symptoms continue to persist or worsen you have been given information for an orthopedic specialist, information is listed on front page for further evaluation

## 2021-12-29 NOTE — ED Triage Notes (Signed)
Fall 2 days ago and having pain in the right hip shooting dow the leg and pain chest shooting into the neck all on the right side. Slipped off the porch and landed on the rail.
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
## 2022-01-22 ENCOUNTER — Ambulatory Visit (HOSPITAL_COMMUNITY)
Admission: EM | Admit: 2022-01-22 | Discharge: 2022-01-22 | Disposition: A | Discharge status: Home / Self Care | Payer: Self-pay | Attending: Physician Assistant | Admitting: Physician Assistant

## 2022-01-22 DIAGNOSIS — S46911A Strain of unspecified muscle, fascia and tendon at shoulder and upper arm level, right arm, initial encounter: Secondary | ICD-10-CM

## 2022-01-22 DIAGNOSIS — M25511 Pain in right shoulder: Secondary | ICD-10-CM

## 2022-01-22 MED ORDER — PREDNISONE 10 MG PO TABS: 10.0000 mg | ORAL_TABLET | Freq: Three times a day (TID) | ORAL | 0 refills | Status: AC

## 2022-01-22 MED ORDER — TIZANIDINE HCL 4 MG PO TABS: 4.0000 mg | ORAL_TABLET | Freq: Four times a day (QID) | ORAL | 0 refills | Status: AC | PRN

## 2022-01-22 MED ORDER — NAPROXEN SODIUM 550 MG PO TABS: 550.0000 mg | ORAL_TABLET | Freq: Two times a day (BID) | ORAL | 0 refills | Status: AC

## 2022-01-22 NOTE — ED Triage Notes (Signed)
Pt is here for right shoulder pain shooting to the back and neck causing pain and discomfort x1wk

## 2022-01-22 NOTE — Discharge Instructions (Signed)
Advised to limit your lifting of heavy objects for the next couple days if possible.  Try not to lift anything greater than 15 pounds on a repetitive basis for the next several days to give the shoulder time to heal. Advised to continue using alternating ice and heat therapy, 10 minutes on 20 minutes off, 4-5 times throughout the evening after work. Advised take prednisone 10 mg every 8 hours until completed to reduce the inflammatory reaction. Advised take the Anaprox DS every 12 hours with food to help reduce pain and swelling. Advised to take the Zanaflex every 6 hours as a muscle relaxer to help reduce muscle spasm. Advised follow-up PCP or return to urgent care if symptoms fail to improve.
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
